# Patient Record
Sex: Female | Born: 1970 | Race: Black or African American | Hispanic: No | Marital: Single | Smoking: Never smoker
Health system: Southern US, Community
[De-identification: ages and names within clinical notes are randomized; demographics above are authoritative.]

## PROBLEM LIST (undated history)

## (undated) DIAGNOSIS — R51 Headache: Secondary | ICD-10-CM

## (undated) DIAGNOSIS — F32A Depression, unspecified: Secondary | ICD-10-CM

## (undated) DIAGNOSIS — F329 Major depressive disorder, single episode, unspecified: Secondary | ICD-10-CM

## (undated) DIAGNOSIS — R519 Headache, unspecified: Secondary | ICD-10-CM

## (undated) DIAGNOSIS — I10 Essential (primary) hypertension: Secondary | ICD-10-CM

## (undated) HISTORY — PX: BREAST SURGERY: SHX581

---

## 2004-11-20 ENCOUNTER — Emergency Department (HOSPITAL_COMMUNITY): Admission: EM | Admit: 2004-11-20 | Discharge: 2004-11-21 | Payer: Self-pay | Admitting: Emergency Medicine

## 2006-03-27 ENCOUNTER — Emergency Department (HOSPITAL_COMMUNITY): Admission: EM | Admit: 2006-03-27 | Discharge: 2006-03-27 | Payer: Self-pay | Admitting: Emergency Medicine

## 2008-01-07 ENCOUNTER — Inpatient Hospital Stay (HOSPITAL_COMMUNITY): Admission: AD | Admit: 2008-01-07 | Discharge: 2008-01-07 | Payer: Self-pay | Admitting: Obstetrics & Gynecology

## 2008-06-28 ENCOUNTER — Inpatient Hospital Stay (HOSPITAL_COMMUNITY): Admission: AD | Admit: 2008-06-28 | Discharge: 2008-06-28 | Payer: Self-pay | Admitting: Obstetrics and Gynecology

## 2008-07-24 ENCOUNTER — Inpatient Hospital Stay (HOSPITAL_COMMUNITY): Admission: AD | Admit: 2008-07-24 | Discharge: 2008-07-24 | Payer: Self-pay | Admitting: Obstetrics and Gynecology

## 2008-07-30 ENCOUNTER — Inpatient Hospital Stay (HOSPITAL_COMMUNITY): Admission: AD | Admit: 2008-07-30 | Discharge: 2008-07-30 | Payer: Self-pay | Admitting: Obstetrics and Gynecology

## 2008-08-04 ENCOUNTER — Inpatient Hospital Stay (HOSPITAL_COMMUNITY): Admission: RE | Admit: 2008-08-04 | Discharge: 2008-08-06 | Payer: Self-pay | Admitting: Obstetrics and Gynecology

## 2008-11-25 ENCOUNTER — Emergency Department (HOSPITAL_COMMUNITY): Admission: EM | Admit: 2008-11-25 | Discharge: 2008-11-25 | Payer: Self-pay | Admitting: Emergency Medicine

## 2008-11-26 ENCOUNTER — Emergency Department (HOSPITAL_COMMUNITY): Admission: EM | Admit: 2008-11-26 | Discharge: 2008-11-26 | Payer: Self-pay | Admitting: Emergency Medicine

## 2009-01-25 ENCOUNTER — Emergency Department (HOSPITAL_COMMUNITY): Admission: EM | Admit: 2009-01-25 | Discharge: 2009-01-25 | Payer: Self-pay | Admitting: Emergency Medicine

## 2009-02-01 ENCOUNTER — Emergency Department (HOSPITAL_COMMUNITY): Admission: EM | Admit: 2009-02-01 | Discharge: 2009-02-01 | Payer: Self-pay | Admitting: Emergency Medicine

## 2009-02-16 ENCOUNTER — Emergency Department (HOSPITAL_COMMUNITY): Admission: EM | Admit: 2009-02-16 | Discharge: 2009-02-16 | Payer: Self-pay | Admitting: Emergency Medicine

## 2010-08-16 LAB — CBC
HCT: 28.9 % — ABNORMAL LOW (ref 36.0–46.0)
Hemoglobin: 9.6 g/dL — ABNORMAL LOW (ref 12.0–15.0)
MCHC: 33.3 g/dL (ref 30.0–36.0)
RDW: 15.5 % (ref 11.5–15.5)

## 2010-08-17 LAB — URINE MICROSCOPIC-ADD ON

## 2010-08-17 LAB — CBC
HCT: 30.3 % — ABNORMAL LOW (ref 36.0–46.0)
MCHC: 33 g/dL (ref 30.0–36.0)
MCV: 81.9 fL (ref 78.0–100.0)
MCV: 82.6 fL (ref 78.0–100.0)
Platelets: 320 10*3/uL (ref 150–400)
Platelets: 330 10*3/uL (ref 150–400)
RBC: 3.83 MIL/uL — ABNORMAL LOW (ref 3.87–5.11)
WBC: 9 10*3/uL (ref 4.0–10.5)
WBC: 9 10*3/uL (ref 4.0–10.5)

## 2010-08-17 LAB — COMPREHENSIVE METABOLIC PANEL
ALT: 14 U/L (ref 0–35)
ALT: 16 U/L (ref 0–35)
AST: 27 U/L (ref 0–37)
AST: 29 U/L (ref 0–37)
AST: 38 U/L — ABNORMAL HIGH (ref 0–37)
Albumin: 2.4 g/dL — ABNORMAL LOW (ref 3.5–5.2)
Albumin: 2.5 g/dL — ABNORMAL LOW (ref 3.5–5.2)
Albumin: 2.7 g/dL — ABNORMAL LOW (ref 3.5–5.2)
Alkaline Phosphatase: 102 U/L (ref 39–117)
Alkaline Phosphatase: 123 U/L — ABNORMAL HIGH (ref 39–117)
BUN: 4 mg/dL — ABNORMAL LOW (ref 6–23)
BUN: 8 mg/dL (ref 6–23)
CO2: 21 mEq/L (ref 19–32)
Calcium: 8.7 mg/dL (ref 8.4–10.5)
Chloride: 103 mEq/L (ref 96–112)
Chloride: 106 mEq/L (ref 96–112)
Creatinine, Ser: 0.41 mg/dL (ref 0.4–1.2)
GFR calc Af Amer: >60 mL/min (ref >60)
GFR calc Af Amer: >60 mL/min (ref >60)
GFR calc non Af Amer: >60 mL/min (ref >60)
Potassium: 3.6 mEq/L (ref 3.5–5.1)
Potassium: 4.8 mEq/L (ref 3.5–5.1)
Sodium: 139 mEq/L (ref 135–145)
Total Bilirubin: 0.4 mg/dL (ref 0.3–1.2)
Total Bilirubin: 0.5 mg/dL (ref 0.3–1.2)
Total Protein: 5.5 g/dL — ABNORMAL LOW (ref 6.0–8.3)

## 2010-08-17 LAB — URINALYSIS, ROUTINE W REFLEX MICROSCOPIC
Bilirubin Urine: NEGATIVE
Glucose, UA: >1000 mg/dL — AB
Hgb urine dipstick: NEGATIVE
Hgb urine dipstick: NEGATIVE
Protein, ur: NEGATIVE mg/dL
Protein, ur: NEGATIVE mg/dL
Urobilinogen, UA: 0.2 mg/dL (ref 0.0–1.0)

## 2010-08-17 LAB — URIC ACID: Uric Acid, Serum: 3.1 mg/dL (ref 2.4–7.0)

## 2010-08-17 LAB — RPR: RPR Ser Ql: NONREACTIVE

## 2010-08-22 LAB — URINALYSIS, ROUTINE W REFLEX MICROSCOPIC
Bilirubin Urine: NEGATIVE
Glucose, UA: NEGATIVE mg/dL
Hgb urine dipstick: NEGATIVE
Ketones, ur: NEGATIVE mg/dL
Nitrite: NEGATIVE
Protein, ur: NEGATIVE mg/dL
Specific Gravity, Urine: 1.01 (ref 1.005–1.030)
Urobilinogen, UA: 0.2 mg/dL (ref 0.0–1.0)
pH: 6.5 (ref 5.0–8.0)

## 2010-08-22 LAB — URINE CULTURE: Colony Count: 80000

## 2010-08-22 LAB — URINE MICROSCOPIC-ADD ON
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
## 2011-02-07 LAB — URINALYSIS, ROUTINE W REFLEX MICROSCOPIC
Glucose, UA: 100 — AB
Ketones, ur: 15 — AB
Leukocytes, UA: NEGATIVE
Nitrite: POSITIVE — AB
Protein, ur: NEGATIVE
Urobilinogen, UA: 1

## 2011-02-07 LAB — WET PREP, GENITAL: Trich, Wet Prep: NONE SEEN

## 2011-02-07 LAB — CBC
HCT: 35.4 — ABNORMAL LOW
Hemoglobin: 11.8 — ABNORMAL LOW
MCHC: 33.3
MCV: 86.6
Platelets: 368
RDW: 13.1

## 2011-02-07 LAB — URINE CULTURE

## 2011-02-07 LAB — URINE MICROSCOPIC-ADD ON

## 2011-02-07 LAB — POCT PREGNANCY, URINE: Preg Test, Ur: POSITIVE

## 2013-02-04 ENCOUNTER — Emergency Department (HOSPITAL_COMMUNITY): Payer: Self-pay

## 2013-02-04 ENCOUNTER — Emergency Department (HOSPITAL_COMMUNITY)
Admission: EM | Admit: 2013-02-04 | Discharge: 2013-02-04 | Disposition: A | Discharge status: Home / Self Care | Payer: Self-pay | Attending: Emergency Medicine | Admitting: Emergency Medicine

## 2013-02-04 ENCOUNTER — Encounter (HOSPITAL_COMMUNITY): Payer: Self-pay

## 2013-02-04 DIAGNOSIS — M7731 Calcaneal spur, right foot: Secondary | ICD-10-CM

## 2013-02-04 DIAGNOSIS — M7730 Calcaneal spur, unspecified foot: Secondary | ICD-10-CM | POA: Insufficient documentation

## 2013-02-04 MED ORDER — IBUPROFEN 800 MG PO TABS: 800.0000 mg | ORAL_TABLET | Freq: Three times a day (TID) | ORAL | Status: DC

## 2013-02-04 MED ORDER — HYDROCODONE-ACETAMINOPHEN 5-325 MG PO TABS: 1.0000 | ORAL_TABLET | Freq: Four times a day (QID) | ORAL | Status: DC | PRN

## 2013-02-04 NOTE — ED Provider Notes (Signed)
CSN: [id number]     Arrival date & time 02/04/13  4098 History   First MD Initiated Contact with Patient 02/04/13 0930     Chief Complaint  Patient presents with  . Foot Pain   (Consider location/radiation/quality/duration/timing/severity/associated sxs/prior Treatment) HPI Comments: Patient presents to the emergency department with chief complaint of right heel pain. She states that the pain began approximately 2 weeks ago. It is progressively worsened. She denies any known injury. She states the pain is worse with weightbearing activity. It is also worse in the morning. 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 She states the pain is moderate to severe. She has not tried taking anything to alleviate her symptoms.  The history is provided by the patient. No language interpreter was used.    History reviewed. No pertinent past medical history. Past Surgical History  Procedure Laterality Date  . Breast surgery     No family history on file. History  Substance Use Topics  . Smoking status: Never Smoker   . Smokeless tobacco: Not on file  . Alcohol Use: No   OB History   Grav Para Term Preterm Abortions TAB SAB Ect Mult Living                 Review of Systems  All other systems reviewed and are negative.    Allergies  Review of patient's allergies indicates no known allergies.  Home Medications   Current Outpatient Rx  Name  Route  Sig  Dispense  Refill  . HYDROcodone-acetaminophen (NORCO/VICODIN) 5-325 MG per tablet   Oral   Take 1 tablet by mouth every 6 (six) hours as needed for pain.   13 tablet   0   . ibuprofen (ADVIL,MOTRIN) 800 MG tablet   Oral   Take 1 tablet (800 mg total) by mouth 3 (three) times daily.   21 tablet   0    BP 163/105  Pulse 86  Temp(Src) 98.2 F (36.8 C) (Oral)  Resp 16  SpO2 98%  LMP 01/05/2013 Physical Exam  Nursing note and vitals reviewed. Constitutional: She is oriented to person, place, and time. She appears well-developed and  well-nourished.  HENT:  Head: Normocephalic and atraumatic.  Eyes: Conjunctivae and EOM are normal.  Neck: Normal range of motion.  Cardiovascular: Normal rate and intact distal pulses.   Brisk capillary refill  Pulmonary/Chest: Effort normal.  Abdominal: She exhibits no distension.  Musculoskeletal: Normal range of motion.  Right Achilles insertion and calcaneus moderately tender to palpation, no obvious bony deformity or abnormality, range of motion and strength 5/5  Neurological: She is alert and oriented to person, place, and time.  Skin: Skin is dry.  Psychiatric: She has a normal mood and affect. Her behavior is normal. Judgment and thought content normal.    ED Course  Procedures (including critical care time) Labs Review Labs Reviewed - No data to display Imaging Review Dg Foot Complete Right  02/04/2013   CLINICAL DATA:  Right heel pain for 2 days, no known injury  EXAM: RIGHT FOOT COMPLETE - 3+ VIEW  COMPARISON:  None  FINDINGS: Osseous mineralization normal.  Joint spaces preserved.  Small plantar calcaneal spur.  No acute fracture, dislocation or bone destruction.  Soft tissues unremarkable.  IMPRESSION: Small plantar calcaneal spur.   Electronically Signed   By: Ulyses Southward M.D.   On: 02/04/2013 09:36    MDM   1. Heel spur, right    Patient with small calcaneal heel spur. Will  treat with NSAIDs, a few pain pills, and recommend Strengthening exercises, ice, and rest. Patient understands and agrees with the plan. She is stable and ready for discharge.    Roxy Horseman, PA-C 02/04/13 1022

## 2013-02-04 NOTE — ED Notes (Signed)
Pt c/o pain to rt heel of foot, unable to bare weight, no know injury

## 2013-02-04 NOTE — ED Provider Notes (Signed)
Medical screening examination/treatment/procedure(s) were performed by non-physician practitioner and as supervising physician I was immediately available for consultation/collaboration.  Rakeya Glab R. Benjermin Korber, MD 02/04/13 1612 

## 2013-08-05 ENCOUNTER — Emergency Department (HOSPITAL_COMMUNITY): Payer: Self-pay

## 2013-08-05 ENCOUNTER — Emergency Department (HOSPITAL_COMMUNITY)
Admission: EM | Admit: 2013-08-05 | Discharge: 2013-08-05 | Disposition: A | Discharge status: Home / Self Care | Payer: Self-pay | Attending: Emergency Medicine | Admitting: Emergency Medicine

## 2013-08-05 ENCOUNTER — Encounter (HOSPITAL_COMMUNITY): Payer: Self-pay | Admitting: Emergency Medicine

## 2013-08-05 DIAGNOSIS — M25539 Pain in unspecified wrist: Secondary | ICD-10-CM | POA: Insufficient documentation

## 2013-08-05 DIAGNOSIS — Z791 Long term (current) use of non-steroidal anti-inflammatories (NSAID): Secondary | ICD-10-CM | POA: Insufficient documentation

## 2013-08-05 MED ORDER — HYDROCODONE-ACETAMINOPHEN 5-325 MG PO TABS: 2.0000 | ORAL_TABLET | ORAL | Status: DC | PRN

## 2013-08-05 MED ORDER — IBUPROFEN 800 MG PO TABS: 800.0000 mg | ORAL_TABLET | Freq: Three times a day (TID) | ORAL | Status: DC

## 2013-08-05 MED ORDER — HYDROCODONE-ACETAMINOPHEN 5-325 MG PO TABS
2.0000 | ORAL_TABLET | Freq: Once | ORAL | Status: AC
  Administered 2013-08-05: 2 via ORAL
  Filled 2013-08-05: qty 2

## 2013-08-05 NOTE — Progress Notes (Signed)
[facility] did not get to see patient but will be sending information about [facility] orange Card program, using the address provided.

## 2013-08-05 NOTE — Discharge Instructions (Signed)
Carpal Tunnel Syndrome  The carpal tunnel is a narrow area located on the palm side of your wrist. The tunnel is formed by the wrist bones and ligaments. Nerves, blood vessels, and tendons pass through the carpal tunnel. Repeated wrist motion or certain diseases may cause swelling within the tunnel. This swelling pinches the main nerve in the wrist (median nerve) and causes the painful hand and arm condition called carpal tunnel syndrome.  CAUSES   · Repeated wrist motions.  · Wrist injuries.  · Certain diseases like arthritis, diabetes, alcoholism, hyperthyroidism, and kidney failure.  · Obesity.  · Pregnancy.  SYMPTOMS   · A "pins and needles" feeling in your fingers or hand.  · Tingling or numbness in your fingers or hand.  · An aching feeling in your entire arm.  · Wrist pain that goes up your arm to your shoulder.  · Pain that goes down into your palm or fingers.  · A weak feeling in your hands.  DIAGNOSIS   Your caregiver will take your history and perform a physical exam. An electromyography test may be needed. This test measures electrical signals sent out by the muscles. The electrical signals are usually slowed by carpal tunnel syndrome. You may also need X-rays.  TREATMENT   Carpal tunnel syndrome may clear up by itself. Your caregiver may recommend a wrist splint or medicine such as a nonsteroidal anti-inflammatory medicine. Cortisone injections may help. Sometimes, surgery may be needed to free the pinched nerve.   HOME CARE INSTRUCTIONS   · Take all medicine as directed by your caregiver. Only take over-the-counter or prescription medicines for pain, discomfort, or fever as directed by your caregiver.  · If you were given a splint to keep your wrist from bending, wear it as directed. It is important to wear the splint at night. Wear the splint for as long as you have pain or numbness in your hand, arm, or wrist. This may take 1 to 2 months.  · Rest your wrist from any activity that may be causing your  pain. If your symptoms are work-related, you may need to talk to your employer about changing to a job that does not require using your wrist.  · Put ice on your wrist after long periods of wrist activity.  · Put ice in a plastic bag.  · Place a towel between your skin and the bag.  · Leave the ice on for 15-20 minutes, 03-04 times a day.  · Keep all follow-up visits as directed by your caregiver. This includes any orthopedic referrals, physical therapy, and rehabilitation. Any delay in getting necessary care could result in a delay or failure of your condition to heal.  SEEK IMMEDIATE MEDICAL CARE IF:   · You have new, unexplained symptoms.  · Your symptoms get worse and are not helped or controlled with medicines.  MAKE SURE YOU:   · Understand these instructions.  · Will watch your condition.  · Will get help right away if you are not doing well or get worse.  Document Released: 04/20/2000 Document Revised: 07/16/2011 Document Reviewed: 03/09/2011  ExitCare® Patient Information ©2014 [employer].  Carpal Tunnel Release  Carpal tunnel release is done to relieve the pressure on the nerves and tendons on the bottom side of your wrist.   LET YOUR CAREGIVER KNOW ABOUT:   · Allergies to food or medicine.  · Medicines taken, including vitamins, herbs, eyedrops, over-the-counter medicines, and creams.  · Use of steroids (by mouth   or creams).  · Previous problems with anesthetics or numbing medicines.  · History of bleeding problems or blood clots.  · Previous surgery.  · Other health problems, including diabetes and kidney problems.  · Possibility of pregnancy, if this applies.  RISKS AND COMPLICATIONS   Some problems that may happen after this procedure include:  · Infection.  · Damage to the nerves, arteries or tendons could occur. This would be very uncommon.  · Bleeding.  BEFORE THE PROCEDURE   · This surgery may be done while you are asleep (general anesthetic) or may be done under a block where only your forearm  and the surgical area is numb.  · If the surgery is done under a block, the numbness will gradually wear off within several hours after surgery.  HOME CARE INSTRUCTIONS   · Have a responsible person with you for 24 hours.  · Do not drive a car or use public transportation for 24 hours.  · Only take over-the-counter or prescription medicines for pain, discomfort, or fever as directed by your caregiver. Take them as directed.  · You may put ice on the palm side of the affected wrist.  · Put ice in a plastic bag.  · Place a towel between your skin and the bag.  · Leave the ice on for 20 to 30 minutes, 4 times per day.  · If you were given a splint to keep your wrist from bending, use it as directed. It is important to wear the splint at night or as directed. Use the splint for as long as you have pain or numbness in your hand, arm, or wrist. This may take 1 to 2 months.  · Keep your hand raised (elevated) above the level of your heart as much as possible. This keeps swelling down and helps with discomfort.  · Change bandages (dressings) as directed.  · Keep the wound clean and dry.  SEEK MEDICAL CARE IF:   · You develop pain not relieved with medications.  · You develop numbness of your hand.  · You develop bleeding from your surgical site.  · You have an oral temperature above 102° F (38.9° C).  · You develop redness or swelling of the surgical site.  · You develop new, unexplained problems.  SEEK IMMEDIATE MEDICAL CARE IF:   · You develop a rash.  · You have difficulty breathing.  · You develop any reaction or side effects to medications given.  Document Released: 07/14/2003 Document Revised: 07/16/2011 Document Reviewed: 02/27/2007  ExitCare® Patient Information ©2014 [employer].

## 2013-08-05 NOTE — ED Provider Notes (Signed)
Medical screening examination/treatment/procedure(s) were performed by non-physician practitioner and as supervising physician I was immediately available for consultation/collaboration.   EKG Interpretation None        Linda Moon Fifield, MD 08/05/13 1421

## 2013-08-05 NOTE — ED Notes (Signed)
Pt A+Ox4, reports c/o 8/10 pain to R wrist since yesterday, pt denies known injury.  Reports "i was at a birthday party yesterday and something might have happened".  MAEI, +csm/+pulses, mild swelling noted, no bruises or deformities noted.  Pt denies n/t to extremities.  Skin PWD.  Speaking full/clear sentences.  NAD.

## 2013-08-05 NOTE — ED Notes (Signed)
Awaiting ortho tech for splint placement.  

## 2013-08-05 NOTE — ED Provider Notes (Signed)
CSN: [id number]     Arrival date & time 08/05/13  1304 History   First MD Initiated Contact with Patient 08/05/13 1308     Chief Complaint  Patient presents with  . Wrist Pain     (Consider location/radiation/quality/duration/timing/severity/associated sxs/prior Treatment) HPI Comments: Patient presents to the emergency department with chief complaint of right wrist pain. She states that she was at a birthday party yesterday, and was doing lots of lifting and cooking. 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 She states the pain is worsened with wrist extension. The pain is 8/10. She has not tried anything to alleviate her symptoms. She denies any other mechanism of injury. Denies any numbness, or tingling.  The history is provided by the patient. No language interpreter was used.    History reviewed. No pertinent past medical history. Past Surgical History  Procedure Laterality Date  . Breast surgery     No family history on file. History  Substance Use Topics  . Smoking status: Never Smoker   . Smokeless tobacco: Not on file  . Alcohol Use: No   OB History   Grav Para Term Preterm Abortions TAB SAB Ect Mult Living                 Review of Systems  Constitutional: Negative for fever and chills.  Respiratory: Negative for shortness of breath.   Cardiovascular: Negative for chest pain.  Gastrointestinal: Negative for nausea, vomiting, diarrhea and constipation.  Genitourinary: Negative for dysuria.  Musculoskeletal: Positive for arthralgias.      Allergies  Review of patient's allergies indicates no known allergies.  Home Medications   Current Outpatient Rx  Name  Route  Sig  Dispense  Refill  . HYDROcodone-acetaminophen (NORCO/VICODIN) 5-325 MG per tablet   Oral   Take 1 tablet by mouth every 6 (six) hours as needed for pain.   13 tablet   0   . ibuprofen (ADVIL,MOTRIN) 800 MG tablet   Oral   Take 1 tablet (800 mg  total) by mouth 3 (three) times daily.   21 tablet   0    BP 162/99  Pulse 101  Temp(Src) 98.4 F (36.9 C) (Oral)  Resp 16  SpO2 97% Physical Exam  Nursing note and vitals reviewed. Constitutional: She is oriented to person, place, and time. She appears well-developed and well-nourished.  HENT:  Head: Normocephalic and atraumatic.  Eyes: Conjunctivae and EOM are normal.  Neck: Normal range of motion.  Cardiovascular: Normal rate.   Pulmonary/Chest: Effort normal.  Abdominal: She exhibits no distension.  Musculoskeletal: Normal range of motion.  Right wrist tender to palpation over the anterior aspect, positive Tinel, positive Phalen, range of motion and strength is reduced secondary to pain  Neurological: She is alert and oriented to person, place, and time.  Skin: Skin is dry.  Psychiatric: She has a normal mood and affect. Her behavior is normal. Judgment and thought content normal.    ED Course  Procedures (including critical care time) Labs Review Labs Reviewed - No data to display Imaging Review No results found.   EKG Interpretation None      MDM   Final diagnoses:  Wrist pain    Patient with right wrist pain. I suspect this is carpal tunnel. However, I will check the wrist plain films. If negative, will splint the rest, and give pain medicine, and hand follow-up.  Plain films negative. Discharge per above.  Roxy Horsemanobert Terriona Horlacher, PA-C 08/05/13 [phone]

## 2014-05-16 ENCOUNTER — Encounter (HOSPITAL_COMMUNITY): Payer: Self-pay | Admitting: Emergency Medicine

## 2014-05-16 ENCOUNTER — Emergency Department (HOSPITAL_COMMUNITY): Payer: Self-pay

## 2014-05-16 ENCOUNTER — Emergency Department (HOSPITAL_COMMUNITY)
Admission: EM | Admit: 2014-05-16 | Discharge: 2014-05-16 | Disposition: A | Discharge status: Home / Self Care | Payer: Self-pay | Attending: Emergency Medicine | Admitting: Emergency Medicine

## 2014-05-16 DIAGNOSIS — Y998 Other external cause status: Secondary | ICD-10-CM | POA: Insufficient documentation

## 2014-05-16 DIAGNOSIS — W1841XA Slipping, tripping and stumbling without falling due to stepping on object, initial encounter: Secondary | ICD-10-CM | POA: Insufficient documentation

## 2014-05-16 DIAGNOSIS — Y9289 Other specified places as the place of occurrence of the external cause: Secondary | ICD-10-CM | POA: Insufficient documentation

## 2014-05-16 DIAGNOSIS — Y9389 Activity, other specified: Secondary | ICD-10-CM | POA: Insufficient documentation

## 2014-05-16 DIAGNOSIS — S93401A Sprain of unspecified ligament of right ankle, initial encounter: Secondary | ICD-10-CM | POA: Insufficient documentation

## 2014-05-16 DIAGNOSIS — M79673 Pain in unspecified foot: Secondary | ICD-10-CM

## 2014-05-16 MED ORDER — HYDROCODONE-ACETAMINOPHEN 5-325 MG PO TABS
2.0000 | ORAL_TABLET | Freq: Once | ORAL | Status: AC
  Administered 2014-05-16: 2 via ORAL
  Filled 2014-05-16: qty 2

## 2014-05-16 MED ORDER — IBUPROFEN 800 MG PO TABS: 800.0000 mg | ORAL_TABLET | Freq: Three times a day (TID) | ORAL | Status: DC

## 2014-05-16 NOTE — ED Provider Notes (Signed)
CSN: [id number]     Arrival date & time 05/16/14  0827 History   First MD Initiated Contact with Patient 05/16/14 [phone]     Chief Complaint  Patient presents with  . Ankle Pain  . Foot Pain     (Consider location/radiation/quality/duration/timing/severity/associated sxs/prior Treatment) HPI Linda Moon is a 44 year old female who presents the ER complaining of right foot pain 1 week. Patient reports she's had this pain in the past, never has had some improvement. She reports over the past week the pain has gotten worse, with worsening of her symptoms throughout the day as she walks on her foot. Patient states this morning she tripped over her dog, twisting her ankle, and is now having foot and ankle pain. Patient denies numbness, weakness, loss of sensation or function.  History reviewed. No pertinent past medical history. Past Surgical History  Procedure Laterality Date  . Breast surgery     No family history on file. History  Substance Use Topics  . Smoking status: Never Smoker   . Smokeless tobacco: Not on file  . Alcohol Use: No   OB History    No data available     Review of Systems  Musculoskeletal: Positive for arthralgias.  Neurological: Negative for weakness and numbness.      Allergies  Review of patient's allergies indicates no known allergies.  Home Medications   Prior to Admission medications   Medication Sig Start Date End Date Taking? Authorizing Provider  ibuprofen (ADVIL,MOTRIN) 800 MG tablet Take 1 tablet (800 mg total) by mouth 3 (three) times daily. 05/16/14   Monte FantasiaJoseph W Debarah Mccumbers, PA-C   BP 163/99 mmHg  Pulse 90  Temp(Src) 98.6 F (37 C) (Oral)  Resp 18  Ht 5\' 4"  (1.626 m)  Wt 186 lb (84.369 kg)  BMI 31.91 kg/m2  SpO2 100%  LMP 05/02/2014 Physical Exam  Constitutional: She appears well-developed and well-nourished. No distress.  HENT:  Head: Normocephalic and atraumatic.  Eyes: Conjunctivae are normal. Right eye exhibits no discharge. Left eye  exhibits no discharge. No scleral icterus.  Cardiovascular:  Peripheral pulses intact at injured extremity.   Pulmonary/Chest: Effort normal. No respiratory distress.  Musculoskeletal:  Right ankle exam: No obvious erythema, edema, warmth, deformity noted. Patient has full range of motion to plantar/dorsiflexion, inversion/eversion with mild amount of pain elicited. Moderate amount of tenderness to palpation of the calcaneal region and dorsal aspect of foot. DP pulse 2+. Distal sensation intact. Capillary refill less than 2 seconds distally.  Neurological: She is alert.  No numbness distal to injury.    Skin: Skin is warm and dry. No rash noted. She is not diaphoretic.  Nursing note and vitals reviewed.   ED Course  Procedures (including critical care time) Labs Review Labs Reviewed - No data to display  Imaging Review Dg Ankle Complete Right  05/16/2014   CLINICAL DATA:  Patient with right heel pain for 1 month. Patient tripped over dog. Pain and swelling on the medial and lateral malleolus.  EXAM: RIGHT ANKLE - COMPLETE 3+ VIEW  COMPARISON:  None.  FINDINGS: Normal anatomic alignment. No evidence for acute fracture or dislocation. Talar dome is intact. Plantar calcaneal spurring. On the oblique view there is a nonspecific radiodensity projecting within the soft tissues overlying the distal aspect of the fibula. Soft tissue swelling about the lateral malleolus.  IMPRESSION: No evidence for acute fracture or dislocation.  Soft tissue swelling about the lateral malleolus.  On the oblique view there is an nonspecific radiodensity  projecting within the soft tissues overlying the distal aspect of the fibula. Recommend clinical correlation as this may overlie the patient.   Electronically Signed   By: Annia Belt M.D.   On: 05/16/2014 10:16   Dg Foot Complete Right  05/16/2014   CLINICAL DATA:  Pain and swelling following tripping injury. One month history of calcaneus region pain  EXAM: RIGHT FOOT  COMPLETE - 3+ VIEW  COMPARISON:  February 04, 2013  FINDINGS: Frontal, oblique, and lateral views were obtained. There is again noted a prominent inferior calcaneal spur. There is no demonstrable fracture or dislocation. There is no appreciable joint space narrowing. No erosive change.  IMPRESSION: Inferior calcaneal spur. No fracture or dislocation. No appreciable arthropathy.   Electronically Signed   By: Bretta Bang M.D.   On: 05/16/2014 10:14     EKG Interpretation None      MDM   Final diagnoses:  Foot pain  Ankle sprain, right, initial encounter    Patient X-Ray negative for obvious fracture or dislocation. Pain managed in ED. Pt advised to follow up with orthopedics if symptoms persist for possible occult fracture or most likely ankle sprain.. Patient given brace while in ED, conservative therapy recommended and discussed. I also discussed return precautions with patient. Patient will be dc home & is agreeable with above plan. I encouraged patient to call or return to the ER should she have any questions or concerns.  BP 163/99 mmHg  Pulse 90  Temp(Src) 98.6 F (37 C) (Oral)  Resp 18  Ht  (1.626 m)  Wt 186 lb (84.369 kg)  BMI 31.91 kg/m2  SpO2 100%  LMP 05/02/2014  Signed,  Ladona Mow, PA-C 5:18 PM    Monte Fantasia, PA-C 05/16/14 1718  Linwood Dibbles, MD 05/17/14 [phone]

## 2014-05-16 NOTE — ED Notes (Signed)
Declined W/C at D/C and was escorted to lobby by RN. 

## 2014-05-16 NOTE — Discharge Instructions (Signed)
Follow-up with orthopedics. Return to the ER if any worsening of symptoms, severe swelling, redness, warmth, high fever, numbness or weakness.  Ankle Sprain An ankle sprain is an injury to the strong, fibrous tissues (ligaments) that hold the bones of your ankle joint together.  CAUSES An ankle sprain is usually caused by a fall or by twisting your ankle. Ankle sprains most commonly occur when you step on the outer edge of your foot, and your ankle turns inward. People who participate in sports are more prone to these types of injuries.  SYMPTOMS   Pain in your ankle. The pain may be present at rest or only when you are trying to stand or walk.  Swelling.  Bruising. Bruising may develop immediately or within 1 to 2 days after your injury.  Difficulty standing or walking, particularly when turning corners or changing directions. DIAGNOSIS  Your caregiver will ask you details about your injury and perform a physical exam of your ankle to determine if you have an ankle sprain. During the physical exam, your caregiver will press on and apply pressure to specific areas of your foot and ankle. Your caregiver will try to move your ankle in certain ways. An X-ray exam may be done to be sure a bone was not broken or a ligament did not separate from one of the bones in your ankle (avulsion fracture).  TREATMENT  Certain types of braces can help stabilize your ankle. Your caregiver can make a recommendation for this. Your caregiver may recommend the use of medicine for pain. If your sprain is severe, your caregiver may refer you to a surgeon who helps to restore function to parts of your skeletal system (orthopedist) or a physical therapist. HOME CARE INSTRUCTIONS   Apply ice to your injury for 1-2 days or as directed by your caregiver. Applying ice helps to reduce inflammation and pain.  Put ice in a plastic bag.  Place a towel between your skin and the bag.  Leave the ice on for 15-20 minutes at a  time, every 2 hours while you are awake.  Only take over-the-counter or prescription medicines for pain, discomfort, or fever as directed by your caregiver.  Elevate your injured ankle above the level of your heart as much as possible for 2-3 days.  If your caregiver recommends crutches, use them as instructed. Gradually put weight on the affected ankle. Continue to use crutches or a cane until you can walk without feeling pain in your ankle.  If you have a plaster splint, wear the splint as directed by your caregiver. Do not rest it on anything harder than a pillow for the first 24 hours. Do not put weight on it. Do not get it wet. You may take it off to take a shower or bath.  You may have been given an elastic bandage to wear around your ankle to provide support. If the elastic bandage is too tight (you have numbness or tingling in your foot or your foot becomes cold and blue), adjust the bandage to make it comfortable.  If you have an air splint, you may blow more air into it or let air out to make it more comfortable. You may take your splint off at night and before taking a shower or bath. Wiggle your toes in the splint several times per day to decrease swelling. SEEK MEDICAL CARE IF:   You have rapidly increasing bruising or swelling.  Your toes feel extremely cold or you lose feeling in  your foot.  Your pain is not relieved with medicine. SEEK IMMEDIATE MEDICAL CARE IF:  Your toes are numb or blue.  You have severe pain that is increasing. MAKE SURE YOU:   Understand these instructions.  Will watch your condition.  Will get help right away if you are not doing well or get worse. Document Released: 04/23/2005 Document Revised: 01/16/2012 Document Reviewed: 05/05/2011 [facility] Patient Information 2015 [place], [place]. This information is not intended to replace advice given to you by your health care provider. Make sure you discuss any questions you have with your health care  provider.

## 2014-05-16 NOTE — ED Notes (Signed)
Pt. Stated, I started having ankle and foot pain for a week, no injury. Unable to put hardly any weight on it.

## 2014-08-31 ENCOUNTER — Emergency Department (HOSPITAL_COMMUNITY): Payer: Self-pay

## 2014-08-31 ENCOUNTER — Encounter (HOSPITAL_COMMUNITY): Payer: Self-pay | Admitting: *Deleted

## 2014-08-31 ENCOUNTER — Emergency Department (HOSPITAL_COMMUNITY)
Admission: EM | Admit: 2014-08-31 | Discharge: 2014-08-31 | Disposition: A | Discharge status: Home / Self Care | Payer: Self-pay | Attending: Emergency Medicine | Admitting: Emergency Medicine

## 2014-08-31 DIAGNOSIS — Z791 Long term (current) use of non-steroidal anti-inflammatories (NSAID): Secondary | ICD-10-CM | POA: Insufficient documentation

## 2014-08-31 DIAGNOSIS — J069 Acute upper respiratory infection, unspecified: Secondary | ICD-10-CM | POA: Insufficient documentation

## 2014-08-31 DIAGNOSIS — R197 Diarrhea, unspecified: Secondary | ICD-10-CM | POA: Insufficient documentation

## 2014-08-31 DIAGNOSIS — R1011 Right upper quadrant pain: Secondary | ICD-10-CM | POA: Insufficient documentation

## 2014-08-31 DIAGNOSIS — Z3202 Encounter for pregnancy test, result negative: Secondary | ICD-10-CM | POA: Insufficient documentation

## 2014-08-31 DIAGNOSIS — R112 Nausea with vomiting, unspecified: Secondary | ICD-10-CM | POA: Insufficient documentation

## 2014-08-31 LAB — URINALYSIS, ROUTINE W REFLEX MICROSCOPIC
Glucose, UA: NEGATIVE mg/dL
Hgb urine dipstick: NEGATIVE
KETONES UR: 15 mg/dL — AB
Nitrite: NEGATIVE
PH: 6.5 (ref 5.0–8.0)
Protein, ur: 100 mg/dL — AB
Specific Gravity, Urine: 1.027 (ref 1.005–1.030)
UROBILINOGEN UA: 1 mg/dL (ref 0.0–1.0)

## 2014-08-31 LAB — CBC WITH DIFFERENTIAL/PLATELET
Basophils Absolute: 0 10*3/uL (ref 0.0–0.1)
Basophils Relative: 0 % (ref 0–1)
Eosinophils Absolute: 0.1 10*3/uL (ref 0.0–0.7)
Eosinophils Relative: 1 % (ref 0–5)
HEMATOCRIT: 42.9 % (ref 36.0–46.0)
HEMOGLOBIN: 14.5 g/dL (ref 12.0–15.0)
LYMPHS PCT: 14 % (ref 12–46)
Lymphs Abs: 1.4 10*3/uL (ref 0.7–4.0)
MCH: 29.3 pg (ref 26.0–34.0)
MCHC: 33.8 g/dL (ref 30.0–36.0)
MCV: 86.7 fL (ref 78.0–100.0)
MONOS PCT: 8 % (ref 3–12)
Monocytes Absolute: 0.8 10*3/uL (ref 0.1–1.0)
NEUTROS ABS: 8.1 10*3/uL — AB (ref 1.7–7.7)
NEUTROS PCT: 77 % (ref 43–77)
Platelets: 259 10*3/uL (ref 150–400)
RBC: 4.95 MIL/uL (ref 3.87–5.11)
RDW: 12.7 % (ref 11.5–15.5)
WBC: 10.4 10*3/uL (ref 4.0–10.5)

## 2014-08-31 LAB — URINE MICROSCOPIC-ADD ON

## 2014-08-31 LAB — POC URINE PREG, ED: PREG TEST UR: NEGATIVE

## 2014-08-31 LAB — COMPREHENSIVE METABOLIC PANEL
ALK PHOS: 60 U/L (ref 39–117)
ALT: 162 U/L — AB (ref 0–35)
ANION GAP: 12 (ref 5–15)
AST: 229 U/L — AB (ref 0–37)
Albumin: 4.3 g/dL (ref 3.5–5.2)
BUN: 7 mg/dL (ref 6–23)
CALCIUM: 9.6 mg/dL (ref 8.4–10.5)
CHLORIDE: 100 mmol/L (ref 96–112)
CO2: 22 mmol/L (ref 19–32)
CREATININE: 0.68 mg/dL (ref 0.50–1.10)
Glucose, Bld: 127 mg/dL — ABNORMAL HIGH (ref 70–99)
Potassium: 4.4 mmol/L (ref 3.5–5.1)
SODIUM: 134 mmol/L — AB (ref 135–145)
TOTAL PROTEIN: 8.1 g/dL (ref 6.0–8.3)
Total Bilirubin: 1.3 mg/dL — ABNORMAL HIGH (ref 0.3–1.2)

## 2014-08-31 LAB — LIPASE, BLOOD: LIPASE: 36 U/L (ref 11–59)

## 2014-08-31 MED ORDER — MORPHINE SULFATE 4 MG/ML IJ SOLN
4.0000 mg | Freq: Once | INTRAMUSCULAR | Status: AC
  Administered 2014-08-31: 4 mg via INTRAVENOUS
  Filled 2014-08-31: qty 1

## 2014-08-31 MED ORDER — ONDANSETRON HCL 4 MG PO TABS: 4.0000 mg | ORAL_TABLET | Freq: Four times a day (QID) | ORAL | Status: DC

## 2014-08-31 MED ORDER — DIPHENHYDRAMINE HCL 50 MG/ML IJ SOLN
25.0000 mg | Freq: Once | INTRAMUSCULAR | Status: AC
  Administered 2014-08-31: 25 mg via INTRAVENOUS
  Filled 2014-08-31: qty 1

## 2014-08-31 MED ORDER — SODIUM CHLORIDE 0.9 % IV BOLUS (SEPSIS)
1000.0000 mL | INTRAVENOUS | Status: AC
  Administered 2014-08-31: 1000 mL via INTRAVENOUS

## 2014-08-31 MED ORDER — PROCHLORPERAZINE EDISYLATE 5 MG/ML IJ SOLN
10.0000 mg | Freq: Once | INTRAMUSCULAR | Status: AC
  Administered 2014-08-31: 10 mg via INTRAVENOUS
  Filled 2014-08-31: qty 2

## 2014-08-31 MED ORDER — PHENYLEPHRINE-DM-GG-APAP 5-10-200-325 MG PO CAPS: 2.0000 | ORAL_CAPSULE | Freq: Three times a day (TID) | ORAL | Status: DC

## 2014-08-31 MED ORDER — KETOROLAC TROMETHAMINE 30 MG/ML IJ SOLN
30.0000 mg | Freq: Once | INTRAMUSCULAR | Status: AC
  Administered 2014-08-31: 30 mg via INTRAVENOUS
  Filled 2014-08-31: qty 1

## 2014-08-31 NOTE — Discharge Instructions (Signed)
Please follow the directions provided. Be sure to follow-up with the gastroenterologist provided for further evaluation of the fatty liver seen on ultrasound. Also use the resource guide below or the referral given to establish care with a primary care doctor to ensure you're getting better.  Be sure to drink plenty of fluids by mouth to stay well hydrated. He may use the Zofran as needed for nausea. You may take the multisymptom cold medicine to help with your ear fullness and nasal congestion and coughing. Don't hesitate to return for any new, worsening, or concerning symptoms.   SEEK IMMEDIATE MEDICAL CARE IF:  You have a fever.  You develop severe or persistent headache, ear pain, sinus pain, or chest pain.  You develop wheezing, a prolonged cough, cough up blood, or have a change in your usual mucus (if you have chronic lung disease).  You develop sore muscles or a stiff neck.  SEEK IMMEDIATE MEDICAL CARE IF:  Your pain does not go away within 2 hours.  You keep throwing up (vomiting).  Your pain is felt only in portions of the abdomen, such as the right side or the left lower portion of the abdomen.  You pass bloody or black tarry stools.   Emergency Department Resource Guide 1) Find a Doctor and Pay Out of Pocket Although you won't have to find out who is covered by your insurance plan, it is a good idea to ask around and get recommendations. You will then need to call the office and see if the doctor you have chosen will accept you as a new patient and what types of options they offer for patients who are self-pay. Some doctors offer discounts or will set up payment plans for their patients who do not have insurance, but you will need to ask so you aren't surprised when you get to your appointment.  2) Contact Your Local Health Department Not all health departments have doctors that can see patients for sick visits, but many do, so it is worth a call to see if yours does. If you don't  know where your local health department is, you can check in your phone book. The CDC also has a tool to help you locate your state's health department, and many state websites also have listings of all of their local health departments.  3) Find a Walk-in Clinic If your illness is not likely to be very severe or complicated, you may want to try a walk in clinic. These are popping up all over the country in pharmacies, drugstores, and shopping centers. They're usually staffed by nurse practitioners or physician assistants that have been trained to treat common illnesses and complaints. They're usually fairly quick and inexpensive. However, if you have serious medical issues or chronic medical problems, these are probably not your best option.  No Primary Care Doctor: - Call Health Connect at  [phone] - they can help you locate a primary care doctor that  accepts your insurance, provides certain services, etc. - Physician Referral Service- [phone]  Chronic Pain Problems: Organization         Address  Phone   Notes  [facility]  [phone] Patients need to be referred by their primary care doctor.   Medication Assistance: Organization         Address  Phone   Notes  [facility] [address] [place], [place] [phone] --Must be a resident of [place] -- Must have NO insurance coverage whatsoever (no Medicaid/ Medicare, etc.) -- The pt. MUST have a primary care doctor that directs their care regularly and follows them in the community   [facility]  [phone]   [employer]  [phone]    Agencies that provide inexpensive medical care: Organization         Address  Phone   Notes  [facility]  [phone]   [facility]    [phone]   [facility] [address] [place], [place] [address] [phone]   [facility]  [place] [address] [place] [place] [phone]   Planned Parenthood    [phone]   [facility]    [phone]   [facility]  [address], [place] Phone:  [phone], Fax:  [phone] Hours of Operation:  9 am - 6 pm, M-F.  Also accepts Medicaid/Medicare and self-pay.  [facility]  [address], [place] Phone: [phone], Fax: [phone]. Hours of Operation:  8:30 am - 5:30 pm, M-F.  Also accepts Medicaid and self-pay.  [facility] [place] [address] [place] Phone: [phone]   [facility] [address] Natasha Bence [place], [place] [phone], Ext. [phone] Mondays & Thursdays: 7-9 AM.  First 15 patients are seen on a first come, first serve basis.    Medicaid-accepting [facility] Providers:  Organization         Address  Phone   Notes  [facility] [address], [place] [phone] Also accepts self-pay patients.  [facility] [address] Laurell Josephs [place], [place]  [phone]   [facility] [address], [place] [phone]   [facility] [address] [place] [phone]   Renaye Rakers [address], [place]   [phone] Only accepts [place] patients after they have their name applied to their card.   Self-Pay (no insurance) in [facility]:  Organization         Address  Phone   Notes  Sickle Cell Patients, [facility] [address] [place], [place] [phone]   [facility] [address] [place], [place] [phone]   [facility]  [address], [place] [phone]   [facility]/Dr. Osei-Bonsu  [address], [place] or [address], [place] [phone] Phone  number for both [place] and [place] locations is the same.  [facility] [address] [place] [phone]   [facility] [address] [place] or [address] [phone]   [facility] [address] [place] [phone], phone; [phone], fax Sees patients 1st and 3rd Saturday of every month.  Must not qualify for public or private insurance (i.e. Medicaid, Medicare, [place] Health Choice, Veterans' Benefits)  Household income should be no more than 200% of the poverty level The clinic cannot treat you if you are pregnant or think you are pregnant  Sexually transmitted diseases are not treated at the clinic.    Dental Care: Organization         Address  Phone  Notes  [facility] of [facility] [address] [place], [place] [phone] Accepts children up to age 76 who are enrolled in [place] or [place] Health Choice; pregnant women with a Medicaid card; and children who have applied for Medicaid or [place] Health Choice, but were declined, whose parents can pay a reduced fee at time of service.  [facility] Department of [facility]  [address], [place] [phone] Accepts children up to age 23 who are enrolled in [place] or [place] Health Choice; pregnant women with a Medicaid card; and children who have applied for Medicaid or [place] Health Choice, but were declined, whose parents can pay a reduced fee at time of service.  [facility]  [address] [place], [place] [phone] Patients are seen by appointment only. Walk-ins are not accepted. [facility] will see patients 64 years of age and older. Monday - Tuesday (8am-5pm) Most Wednesdays (8:30-5pm) $30 per visit, cash only  [facility] Adult Dental Access PROGRAM  [address], [facility] [phone] Patients are seen by appointment only.  Walk-ins are not accepted. [facility] will see patients 77 years of age and older. One Wednesday Evening (Monthly: Volunteer Based).  $30 per visit, cash only  [employer]  [phone] for adults; Children under age 5, call Graduate Pediatric Dentistry at [phone]. Children aged 33-14, please call [phone] to request a pediatric application.  Dental services are provided in all areas of dental care including fillings, crowns and bridges, complete and partial dentures, implants, gum treatment, root canals, and extractions. Preventive care is also provided. Treatment is provided to both adults and children. Patients are selected via a lottery and there is often a waiting list.   [facility] [address] [place]  [phone] [email or web address]   [facility] [address] [place], [place] [phone], Ext. [phone] Second and Fourth Thursday of each month, opens at 6:30 AM; Clinic ends at 9 AM.  Patients are seen on a first-come first-served basis, and a limited number are seen during each clinic.   [facility]  [address] Ether Griffins [place], [place] [phone]   Eligibility Requirements You must have lived in [place], [place], or [place] counties for at least the last three months.   You cannot be eligible for state or federal sponsored [place], including [employer], [place], or [employer].   You generally cannot be eligible for healthcare insurance through your employer.    How to apply: Eligibility screenings are held every Tuesday and Wednesday afternoon from 1:00 pm until 4:00 pm. You do not need an appointment for the interview!  [facility] [address], [place], [place] [phone]   [facility] Health Department  [phone]   [facility] Health Department  [phone]   [facility] Health Department  [phone]    Behavioral Health  Resources in the Community: Intensive Outpatient Programs Organization         Address  Phone  Notes  [facility] [address] [place], [place] [phone]   [facility] [address] [place], [place] [phone]   [facility]: [facility] [address], [place], [place]  [phone]   [facility] Health [address],  [place], [place] [phone] or [phone]   Substance Abuse Resources Organization  Address  Phone  Notes  [facility] Drug Services  [phone]   [facility]  [phone]   [facility] [place]  [phone]   Floydene Flock  [phone]   Residential & Outpatient Substance Abuse Program  [phone]   Psychological Services Organization         Address  Phone  Notes  [facility]  [phone]   [facility]  [phone]   [facility] Mental Health [address] [place] [phone] or [phone]    Mobile Crisis Teams Organization         Address  Phone  Notes  [facility], Mobile Crisis Care Unit  [phone]   [facility]  [address] [place], [place] [phone]   Doristine Locks [address] [place] [phone]    Self-Help/Support Groups Organization         Address  Phone             Notes  [facility] [place] - variety of support groups  336- I7437963 Call for more information  Narcotics Anonymous (NA), [facility] [address], [employer] [place]  2 meetings at this location   Statistician         Address  Phone  Notes  [facility] [address],    [place]  [phone]   [facility]  [address] [place] [address] [place] [address], [place], [place] [phone]   [facility] [address] [place], [place] [phone] Admissions: 8am-3pm M-F  [facility] [address]    [place], [place] [phone]   [facility] [address] [place], [place], [place] [phone]   [facility] [address]  [place], [place] [phone]   [facility] - Intensive Outpatient [address] Laurell Joseph[address], [place], [place] [phone]   [facility] (Addiction Recovery [facility] [address] [place]  [place], [place] [phone] or [phone]   [facility] ([facility]) [address] [place], [place] [phone] Accepts Medicaid  [facility] [address]  [place] [phone] Substance Abuse/Addiction Treatment   [facility] Organization         Address  Phone  Notes  [facility]  [phone]   Angie Fava, PhD [address] Ervin Knack [place], [place]   [phone] or [phone]   [facility]al   [address] [place], [place] [phone]   [facility] [address] [place], [place] [phone] Insurance/Medicaid/sponsorship through [facility] and Families [address]                                    [place], [place] [phone] Therapy/tele-psych/case  [facility] [address], [place] [phone]    Dr. Lolly Mustache  [phone]   [facility]  [employer] [facility] Dept. 1) [address] [place] 2) [address] [place] 3)  [address], [place] [phone]  [phone]   [facility] Child Abuse Hotline (626)531-9097 or [phone] (After Hours)

## 2014-08-31 NOTE — ED Provider Notes (Signed)
CSN: [id number]     Arrival date & time 08/31/14  1004 History   First MD Initiated Contact with Patient 08/31/14 1021     Chief Complaint  Patient presents with  . URI  . Migraine  . Abdominal Pain   (Consider location/radiation/quality/duration/timing/severity/associated sxs/prior Treatment) HPI  Linda Moon is a 44 yo female presenting with multiple symptoms.  She states she again having vomiting after eating with associated loose stools beginning 6 days ago. She also reports some right upper quadrant pain after eating also. 2 days later she began having nasal congestion, runny nose, ear fullness and coughing. The vomiting and loose stools have continued till today. She reports she has only been drinking fluids because of the vomiting and she still vomits whatever she drinks.  She reports the vomit looks like what she drank but denies any bloody emesis or stools.  She also denies any chills, chest pain or shortness of breath.   History reviewed. No pertinent past medical history. Past Surgical History  Procedure Laterality Date  . Breast surgery     No family history on file. History  Substance Use Topics  . Smoking status: Never Smoker   . Smokeless tobacco: Not on file  . Alcohol Use: No   OB History    No data available     Review of Systems  Constitutional: Negative for fever and chills.  HENT: Positive for congestion. Negative for sore throat.   Eyes: Negative for visual disturbance.  Respiratory: Negative for cough and shortness of breath.   Cardiovascular: Negative for chest pain and leg swelling.  Gastrointestinal: Positive for nausea, vomiting, abdominal pain and diarrhea.  Genitourinary: Negative for dysuria.  Musculoskeletal: Negative for myalgias.  Skin: Negative for rash.  Neurological: Positive for headaches. Negative for weakness and numbness.      Allergies  Review of patient's allergies indicates no known allergies.  Home Medications   Prior to  Admission medications   Medication Sig Start Date End Date Taking? Authorizing Provider  ibuprofen (ADVIL,MOTRIN) 800 MG tablet Take 1 tablet (800 mg total) by mouth 3 (three) times daily. 05/16/14   Ladona MowJoe Mintz, PA-C   BP 149/96 mmHg  Pulse 105  Temp(Src) 98.1 F (36.7 C) (Oral)  Resp 18  Ht 5\' 4"  (1.626 m)  Wt 182 lb 9.6 oz (82.827 kg)  BMI 31.33 kg/m2  SpO2 98% Physical Exam  Constitutional: She appears well-developed and well-nourished. No distress.  HENT:  Head: Normocephalic and atraumatic.  Mouth/Throat: Oropharynx is clear and moist. No oropharyngeal exudate.  Eyes: Conjunctivae are normal.  Neck: Neck supple.  Cardiovascular: Normal rate, regular rhythm and intact distal pulses.   Pulmonary/Chest: Effort normal and breath sounds normal. No respiratory distress. She has no wheezes. She has no rales. She exhibits no tenderness.  Abdominal: Soft. Bowel sounds are normal. She exhibits no distension and no mass. There is no hepatosplenomegaly. There is tenderness in the right upper quadrant. There is positive Murphy's sign. There is no rigidity, no rebound, no guarding, no CVA tenderness and no tenderness at McBurney's point.    Musculoskeletal: She exhibits no tenderness.  Lymphadenopathy:    She has no cervical adenopathy.  Neurological: She is alert.  Skin: Skin is warm and dry. No rash noted. She is not diaphoretic.  Psychiatric: She has a normal mood and affect.  Nursing note and vitals reviewed.   ED Course  Procedures (including critical care time) Labs Review Labs Reviewed  CBC WITH DIFFERENTIAL/PLATELET - Abnormal;  Notable for the following:    Neutro Abs 8.1 (*)    All other components within normal limits  COMPREHENSIVE METABOLIC PANEL - Abnormal; Notable for the following:    Sodium 134 (*)    Glucose, Bld 127 (*)    AST 229 (*)    ALT 162 (*)    Total Bilirubin 1.3 (*)    All other components within normal limits  URINALYSIS, ROUTINE W REFLEX MICROSCOPIC  - Abnormal; Notable for the following:    Color, Urine AMBER (*)    APPearance CLOUDY (*)    Bilirubin Urine SMALL (*)    Ketones, ur 15 (*)    Protein, ur 100 (*)    Leukocytes, UA LARGE (*)    All other components within normal limits  URINE MICROSCOPIC-ADD ON - Abnormal; Notable for the following:    Squamous Epithelial / LPF MANY (*)    Bacteria, UA MANY (*)    All other components within normal limits  LIPASE, BLOOD  POC URINE PREG, ED    Imaging Review US Abdomen Limited Ruq  08/31/2014   CLINICAL DATA:  Right upper quadrant pain for 1 week  EXAM: US ABDOMEN LIMITED - RIGHT UPPER QUADRANT  COMPARISON:  None.  FINDINGS: Gallbladder:  No gallstones or wall thickening visualized. No sonographic Murphy sign noted.  Common bile duct:  Diameter: Normal caliber, 4 mm  Liver:  Heterogeneous, increased echotexture suggesting fatty infiltration. No focal abnormality or biliary ductal dilatation.  IMPRESSION: Diffuse fatty infiltration of the liver.  No cholelithiasis or acute cholecystitis.   Electronically Signed   By: Charlett Nose M.D.   On: 08/31/2014 15:08     EKG Interpretation None      MDM   Final diagnoses:  RUQ pain  URI, acute   44 yo with RUQ pain and vomiting followed by symptoms consistent with URI. She has large leukocytes and 21-50 WBC in her urine but no urinary symptoms. She also ketones in her urine consistent with vomiting and dehydration. She has elevated AST and ALT, 229 and 162 respectively. Her RUQ [place] doesn't show any gall stones but a fatty liver. Her symptoms improved after NS bolus and pain and nausea meds. She is well-appearing, in no acute distress and vital signs reviewed and not concerning. She appears safe to be discharged.  Discharge include follow-up with GI. Return precautions provided. Pt aware of plan and in agreement.     Filed Vitals:   08/31/14 1530 08/31/14 1545 08/31/14 1600 08/31/14 1608  BP: 137/88 146/89 142/91   Pulse: 75 80 82   Temp:     98.2 F (36.8 C)  TempSrc:    Oral  Resp:      Height:      Weight:      SpO2: 93% 97% 96%    Meds given in ED:  Medications  sodium chloride 0.9 % bolus 1,000 mL (0 mLs Intravenous Stopped 08/31/14 1355)  ketorolac (TORADOL) 30 MG/ML injection 30 mg (30 mg Intravenous Given 08/31/14 1140)  prochlorperazine (COMPAZINE) injection 10 mg (10 mg Intravenous Given 08/31/14 1141)  diphenhydrAMINE (BENADRYL) injection 25 mg (25 mg Intravenous Given 08/31/14 1140)  morphine 4 MG/ML injection 4 mg (4 mg Intravenous Given 08/31/14 1352)  sodium chloride 0.9 % bolus 1,000 mL (0 mLs Intravenous Stopped 08/31/14 1610)    Discharge Medication List as of 08/31/2014  3:49 PM    START taking these medications   Details  ondansetron (ZOFRAN) 4 MG tablet Take  1 tablet (4 mg total) by mouth every 6 (six) hours., Starting 08/31/2014, Until Discontinued, Print    Phenylephrine-DM-GG-APAP (MUCINEX FAST-MAX) 5-10-200-325 MG CAPS Take 2 capsules by mouth 3 (three) times daily., Starting 08/31/2014, Until Discontinued, Print           Harle Battiest, NP 09/01/14 1436  Vanetta Mulders, MD 09/02/14 1728

## 2014-08-31 NOTE — ED Notes (Signed)
Pt states that she has ear pain, nasal congestion, cough, migraine, and rt sided abdominal pain for 1 week. Pt reports n/v as well.

## 2015-10-31 ENCOUNTER — Emergency Department (HOSPITAL_COMMUNITY): Payer: Self-pay

## 2015-10-31 ENCOUNTER — Encounter (HOSPITAL_COMMUNITY): Payer: Self-pay

## 2015-10-31 ENCOUNTER — Emergency Department (HOSPITAL_COMMUNITY)
Admission: EM | Admit: 2015-10-31 | Discharge: 2015-10-31 | Disposition: A | Discharge status: Home / Self Care | Payer: Self-pay | Attending: Dermatology | Admitting: Dermatology

## 2015-10-31 ENCOUNTER — Other Ambulatory Visit: Payer: Self-pay

## 2015-10-31 DIAGNOSIS — R079 Chest pain, unspecified: Secondary | ICD-10-CM | POA: Insufficient documentation

## 2015-10-31 DIAGNOSIS — R1030 Lower abdominal pain, unspecified: Secondary | ICD-10-CM | POA: Insufficient documentation

## 2015-10-31 DIAGNOSIS — Z5321 Procedure and treatment not carried out due to patient leaving prior to being seen by health care provider: Secondary | ICD-10-CM | POA: Insufficient documentation

## 2015-10-31 DIAGNOSIS — N939 Abnormal uterine and vaginal bleeding, unspecified: Secondary | ICD-10-CM | POA: Insufficient documentation

## 2015-10-31 DIAGNOSIS — I10 Essential (primary) hypertension: Secondary | ICD-10-CM | POA: Insufficient documentation

## 2015-10-31 LAB — COMPREHENSIVE METABOLIC PANEL
ALK PHOS: 60 U/L (ref 38–126)
ALT: 128 U/L — ABNORMAL HIGH (ref 14–54)
ANION GAP: 13 (ref 5–15)
AST: 211 U/L — ABNORMAL HIGH (ref 15–41)
Albumin: 4.2 g/dL (ref 3.5–5.0)
BILIRUBIN TOTAL: 0.6 mg/dL (ref 0.3–1.2)
BUN: 6 mg/dL (ref 6–20)
CO2: 20 mmol/L — ABNORMAL LOW (ref 22–32)
Calcium: 9.5 mg/dL (ref 8.9–10.3)
Chloride: 104 mmol/L (ref 101–111)
Creatinine, Ser: 0.6 mg/dL (ref 0.44–1.00)
GFR calc non Af Amer: >60 mL/min (ref >60)
Glucose, Bld: 89 mg/dL (ref 65–99)
Potassium: 4.1 mmol/L (ref 3.5–5.1)
SODIUM: 137 mmol/L (ref 135–145)
TOTAL PROTEIN: 7.6 g/dL (ref 6.5–8.1)

## 2015-10-31 LAB — CBC
HCT: 37.5 % (ref 36.0–46.0)
HEMOGLOBIN: 12.4 g/dL (ref 12.0–15.0)
MCH: 28.1 pg (ref 26.0–34.0)
MCHC: 33.1 g/dL (ref 30.0–36.0)
MCV: 85 fL (ref 78.0–100.0)
Platelets: 276 10*3/uL (ref 150–400)
RBC: 4.41 MIL/uL (ref 3.87–5.11)
RDW: 13.4 % (ref 11.5–15.5)
WBC: 6.3 10*3/uL (ref 4.0–10.5)

## 2015-10-31 LAB — I-STAT TROPONIN, ED: TROPONIN I, POC: 0 ng/mL (ref 0.00–0.08)

## 2015-10-31 LAB — URINALYSIS, ROUTINE W REFLEX MICROSCOPIC
Bilirubin Urine: NEGATIVE
Glucose, UA: NEGATIVE mg/dL
Hgb urine dipstick: NEGATIVE
Ketones, ur: NEGATIVE mg/dL
Leukocytes, UA: NEGATIVE
NITRITE: NEGATIVE
Protein, ur: NEGATIVE mg/dL
SPECIFIC GRAVITY, URINE: 1.007 (ref 1.005–1.030)
pH: 6 (ref 5.0–8.0)

## 2015-10-31 LAB — LIPASE, BLOOD: Lipase: 55 U/L — ABNORMAL HIGH (ref 11–51)

## 2015-10-31 NOTE — ED Notes (Signed)
Per Pt, Pt is coming from work. Reports bleeding and spotting from her vagina for two weeks without completing her cycle. Continuous pain in her lower abdomen. Pt reports today, she started to have left chest pain that was a dull sore pain that was decreased by resting and sitting.

## 2015-11-01 ENCOUNTER — Inpatient Hospital Stay (HOSPITAL_COMMUNITY)
Admission: AD | Admit: 2015-11-01 | Discharge: 2015-11-01 | Disposition: A | Discharge status: Home / Self Care | Payer: Self-pay | Source: Ambulatory Visit | Attending: Family Medicine | Admitting: Family Medicine

## 2015-11-01 ENCOUNTER — Encounter (HOSPITAL_COMMUNITY): Payer: Self-pay | Admitting: *Deleted

## 2015-11-01 DIAGNOSIS — N72 Inflammatory disease of cervix uteri: Secondary | ICD-10-CM | POA: Insufficient documentation

## 2015-11-01 DIAGNOSIS — R748 Abnormal levels of other serum enzymes: Secondary | ICD-10-CM | POA: Insufficient documentation

## 2015-11-01 DIAGNOSIS — Z888 Allergy status to other drugs, medicaments and biological substances status: Secondary | ICD-10-CM | POA: Insufficient documentation

## 2015-11-01 DIAGNOSIS — N760 Acute vaginitis: Secondary | ICD-10-CM | POA: Insufficient documentation

## 2015-11-01 DIAGNOSIS — N939 Abnormal uterine and vaginal bleeding, unspecified: Secondary | ICD-10-CM

## 2015-11-01 DIAGNOSIS — I10 Essential (primary) hypertension: Secondary | ICD-10-CM | POA: Insufficient documentation

## 2015-11-01 DIAGNOSIS — T192XXA Foreign body in vulva and vagina, initial encounter: Secondary | ICD-10-CM

## 2015-11-01 DIAGNOSIS — R1030 Lower abdominal pain, unspecified: Secondary | ICD-10-CM

## 2015-11-01 HISTORY — DX: Headache: R51

## 2015-11-01 HISTORY — DX: Major depressive disorder, single episode, unspecified: F32.9

## 2015-11-01 HISTORY — DX: Depression, unspecified: F32.A

## 2015-11-01 HISTORY — DX: Headache, unspecified: R51.9

## 2015-11-01 HISTORY — DX: Essential (primary) hypertension: I10

## 2015-11-01 LAB — URINALYSIS, ROUTINE W REFLEX MICROSCOPIC
Bilirubin Urine: NEGATIVE
Glucose, UA: NEGATIVE mg/dL
Ketones, ur: NEGATIVE mg/dL
LEUKOCYTES UA: NEGATIVE
NITRITE: NEGATIVE
PROTEIN: NEGATIVE mg/dL
pH: 6 (ref 5.0–8.0)

## 2015-11-01 LAB — WET PREP, GENITAL
Sperm: NONE SEEN
Trich, Wet Prep: NONE SEEN
Yeast Wet Prep HPF POC: NONE SEEN

## 2015-11-01 LAB — CBC
HCT: 38.3 % (ref 36.0–46.0)
Hemoglobin: 13.1 g/dL (ref 12.0–15.0)
MCH: 29 pg (ref 26.0–34.0)
MCHC: 34.2 g/dL (ref 30.0–36.0)
MCV: 84.9 fL (ref 78.0–100.0)
Platelets: 271 10*3/uL (ref 150–400)
RBC: 4.51 MIL/uL (ref 3.87–5.11)
RDW: 13.7 % (ref 11.5–15.5)
WBC: 7.2 10*3/uL (ref 4.0–10.5)

## 2015-11-01 LAB — POCT PREGNANCY, URINE: Preg Test, Ur: NEGATIVE

## 2015-11-01 LAB — URINE MICROSCOPIC-ADD ON: WBC UA: NONE SEEN WBC/hpf (ref 0–5)

## 2015-11-01 MED ORDER — METRONIDAZOLE 500 MG PO TABS: 500.0000 mg | ORAL_TABLET | Freq: Two times a day (BID) | ORAL | Status: DC

## 2015-11-01 MED ORDER — HYDROCHLOROTHIAZIDE 25 MG PO TABS
25.0000 mg | ORAL_TABLET | Freq: Every day | ORAL | Status: DC
  Administered 2015-11-01: 25 mg via ORAL
  Filled 2015-11-01 (×2): qty 1

## 2015-11-01 MED ORDER — AZITHROMYCIN 250 MG PO TABS
1000.0000 mg | ORAL_TABLET | Freq: Once | ORAL | Status: AC
  Administered 2015-11-01: 1000 mg via ORAL
  Filled 2015-11-01: qty 4

## 2015-11-01 MED ORDER — KETOROLAC TROMETHAMINE 60 MG/2ML IM SOLN
60.0000 mg | Freq: Once | INTRAMUSCULAR | Status: AC
  Administered 2015-11-01: 60 mg via INTRAMUSCULAR
  Filled 2015-11-01: qty 2

## 2015-11-01 MED ORDER — CEFTRIAXONE SODIUM 250 MG IJ SOLR
250.0000 mg | Freq: Once | INTRAMUSCULAR | Status: AC
  Administered 2015-11-01: 250 mg via INTRAMUSCULAR
  Filled 2015-11-01: qty 250

## 2015-11-01 MED ORDER — HYDROCHLOROTHIAZIDE 25 MG PO TABS: 25.0000 mg | ORAL_TABLET | Freq: Every day | ORAL | Status: AC

## 2015-11-01 NOTE — MAU Note (Signed)
Has been bleeding for 2 wks,, started when expected period.  Had stopped, then started again when intimate, now spotting.  Painful intercourse, pain in lower abd.

## 2015-11-01 NOTE — MAU Note (Signed)
Urine in lab 

## 2015-11-01 NOTE — MAU Note (Signed)
[facility] Waited "[facility]" and was told she was #19 in line so she left because she had her 44 year old daughter with her.

## 2015-11-01 NOTE — MAU Note (Signed)
Supposed to by on BP med, can not afford it

## 2015-11-01 NOTE — MAU Provider Note (Signed)
History     CSN: [id number]  Arrival date and time: 11/01/15 1105   First Provider Initiated Contact with Patient 11/01/15 1309      Chief Complaint  Patient presents with  . Vaginal Bleeding  . Abdominal Pain   HPI  Pt is 45 yo AA not pregnant [id number] who presents with lower abdominal pain and abnormal uterine bleeding. Pt states she has had RCM until last period and has lasted for 2 weeks.  Pt also had pain with IC last encounter- with fiance who has been together with pt for 1 year. Pt denies constipation or diarrhea, chills or fever. Pt has not taken any medications.  Pt states that ibuprofen makes her have nausea and vomiting. Pt has hx of hypertension- not on medications today Pt has not had pap smear recently due to no insurance Review of records indicate that pt has elevated liver enzymes-[facility] and labs drawn but pt did not wait to be seen.  Pt was told she had high blood pressure.  A previous note stated that pt needed to have GI consult.  Pt has not had a GI consult.    Past Medical History  Diagnosis Date  . Depression     postpartum  . Headache     migraine  . Hypertension     Past Surgical History  Procedure Laterality Date  . Breast surgery      History reviewed. No pertinent family history.  Social History  Substance Use Topics  . Smoking status: Never Smoker   . Smokeless tobacco: None  . Alcohol Use: Yes     Comment: 1-2/day    Allergies:  Allergies  Allergen Reactions  . Ibuprofen Nausea Only    Prescriptions prior to admission  Medication Sig Dispense Refill Last Dose  . ibuprofen (ADVIL,MOTRIN) 800 MG tablet Take 1 tablet (800 mg total) by mouth 3 (three) times daily. (Patient not taking: Reported on 08/31/2014) 21 tablet 0 Completed Course at Unknown time  . ondansetron (ZOFRAN) 4 MG tablet Take 1 tablet (4 mg total) by mouth every 6 (six) hours. (Patient not taking: Reported on 11/01/2015) 12 tablet 0   .  Phenylephrine-DM-GG-APAP (MUCINEX FAST-MAX) 5-10-200-325 MG CAPS Take 2 capsules by mouth 3 (three) times daily. (Patient not taking: Reported on 11/01/2015) 60 capsule 0     Review of Systems  Constitutional: Negative for fever and chills.  Gastrointestinal: Positive for abdominal pain. Negative for nausea, vomiting, diarrhea and constipation.  Genitourinary: Negative for dysuria.  Neurological: Negative for headaches.   Physical Exam   Blood pressure 159/101, pulse 97, temperature 98 F (36.7 C), temperature source Oral, resp. rate 18, weight 166 lb 12 oz (75.637 kg), last menstrual period 10/16/2015.  Physical Exam  Vitals reviewed. Constitutional: She is oriented to person, place, and time. She appears well-developed and well-nourished. No distress.  HENT:  Head: Normocephalic.  Eyes: Pupils are equal, round, and reactive to light.  Neck: Normal range of motion. Neck supple.  Cardiovascular: Normal rate.   Respiratory: Effort normal.  GI: Soft. She exhibits no distension. There is no tenderness. There is no rebound and no guarding.  Genitourinary:  Retained tampon removed with ring forceps intact without difficulty.  Some erosion noted on posterior cervix 6:30- minimal bleeding.  No bleeding noted in vagina or from cervix. Cervix tender with manipulation and right adnexa mildly tender without palpable enlargement- uterus and left adnexa diffusely tender- no rebound.  Musculoskeletal: Normal range of  motion.  Neurological: She is alert and oriented to person, place, and time.  Skin: Skin is warm and dry.    MAU Course  Procedures Results for orders placed or performed during the hospital encounter of 11/01/15 (from the past 24 hour(s))  Pregnancy, urine POC     Status: None   Collection Time: 11/01/15 11:20 AM  Result Value Ref Range   Preg Test, Ur NEGATIVE NEGATIVE  CBC     Status: None   Collection Time: 11/01/15 12:22 PM  Result Value Ref Range   WBC 7.2 4.0 - 10.5  K/uL   RBC 4.51 3.87 - 5.11 MIL/uL   Hemoglobin 13.1 12.0 - 15.0 g/dL   HCT 16.138.3 09.636.0 - 04.546.0 %   MCV 84.9 78.0 - 100.0 fL   MCH 29.0 26.0 - 34.0 pg   MCHC 34.2 30.0 - 36.0 g/dL   RDW 40.913.7 81.111.5 - 91.415.5 %   Platelets 271 150 - 400 K/uL   Results for orders placed or performed during the hospital encounter of 11/01/15 (from the past 24 hour(s))  Urinalysis, Routine w reflex microscopic (not at [facility])     Status: Abnormal   Collection Time: 11/01/15 11:10 AM  Result Value Ref Range   Color, Urine YELLOW YELLOW   APPearance CLEAR CLEAR   Specific Gravity, Urine <1.005 (L) 1.005 - 1.030   pH 6.0 5.0 - 8.0   Glucose, UA NEGATIVE NEGATIVE mg/dL   Hgb urine dipstick TRACE (A) NEGATIVE   Bilirubin Urine NEGATIVE NEGATIVE   Ketones, ur NEGATIVE NEGATIVE mg/dL   Protein, ur NEGATIVE NEGATIVE mg/dL   Nitrite NEGATIVE NEGATIVE   Leukocytes, UA NEGATIVE NEGATIVE  Urine microscopic-add on     Status: Abnormal   Collection Time: 11/01/15 11:10 AM  Result Value Ref Range   Squamous Epithelial / LPF 0-5 (A) NONE SEEN   WBC, UA NONE SEEN 0 - 5 WBC/hpf   RBC / HPF 0-5 0 - 5 RBC/hpf   Bacteria, UA RARE (A) NONE SEEN  Pregnancy, urine POC     Status: None   Collection Time: 11/01/15 11:20 AM  Result Value Ref Range   Preg Test, Ur NEGATIVE NEGATIVE  CBC     Status: None   Collection Time: 11/01/15 12:22 PM  Result Value Ref Range   WBC 7.2 4.0 - 10.5 K/uL   RBC 4.51 3.87 - 5.11 MIL/uL   Hemoglobin 13.1 12.0 - 15.0 g/dL   HCT 78.238.3 95.636.0 - 21.346.0 %   MCV 84.9 78.0 - 100.0 fL   MCH 29.0 26.0 - 34.0 pg   MCHC 34.2 30.0 - 36.0 g/dL   RDW 08.613.7 57.811.5 - 46.915.5 %   Platelets 271 150 - 400 K/uL  Wet prep, genital     Status: Abnormal   Collection Time: 11/01/15  1:24 PM  Result Value Ref Range   Yeast Wet Prep HPF POC NONE SEEN NONE SEEN   Trich, Wet Prep NONE SEEN NONE SEEN   Clue Cells Wet Prep HPF POC PRESENT (A) NONE SEEN   WBC, Wet Prep HPF POC FEW (A) NONE SEEN   Sperm NONE SEEN   GC/chlamydia  pending Rocephin 250mg  IM and Zithromax 1 gm PO given for cervicitis Rx for Flagyl 500mg  BID for 7 days- pt to look at [facility] for cheapest price HCTZ 25mg  given for hypertension- importance of following up with provider discussed- [facility]; also [facility] Discussed with Dr. Shawnie PonsPratt  Pt need pap smear- free Pap clinic discussed with pt and sister   Assessment and Plan  Abnormal uterine bleeding- follow up with [facility] if continues Retained tampon- removed Cervicitis- Rocpe[place] and Zithromax given in MAU BV- Rx Flagyl  BID for 7 days Hypertension- Rx HCTZ #30- pt to f/u with PCP Elevated liver enzymes- stop NSAIDs; f/u with PCP Discussed with pt [facility]; sister goes to [facility]- sister will help pt get care  Estephania Licciardi 11/01/2015, 1:40 PM

## 2015-11-01 NOTE — Discharge Instructions (Signed)
Stop your Ibuprofen until you have further evaluation due to your high liver enzymes

## 2015-11-02 LAB — GC/CHLAMYDIA PROBE AMP (~~LOC~~) NOT AT ARMC
CHLAMYDIA, DNA PROBE: NEGATIVE
Neisseria Gonorrhea: NEGATIVE

## 2015-11-02 LAB — HIV ANTIBODY (ROUTINE TESTING W REFLEX): HIV Screen 4th Generation wRfx: NONREACTIVE

## 2017-07-03 ENCOUNTER — Emergency Department (HOSPITAL_COMMUNITY)
Admission: EM | Admit: 2017-07-03 | Discharge: 2017-07-03 | Disposition: A | Discharge status: Home / Self Care | Payer: Self-pay | Attending: Emergency Medicine | Admitting: Emergency Medicine

## 2017-07-03 ENCOUNTER — Encounter (HOSPITAL_COMMUNITY): Payer: Self-pay | Admitting: *Deleted

## 2017-07-03 ENCOUNTER — Emergency Department (HOSPITAL_COMMUNITY): Payer: Self-pay

## 2017-07-03 ENCOUNTER — Other Ambulatory Visit: Payer: Self-pay

## 2017-07-03 DIAGNOSIS — I10 Essential (primary) hypertension: Secondary | ICD-10-CM | POA: Insufficient documentation

## 2017-07-03 DIAGNOSIS — Z7289 Other problems related to lifestyle: Secondary | ICD-10-CM

## 2017-07-03 DIAGNOSIS — Z789 Other specified health status: Secondary | ICD-10-CM

## 2017-07-03 DIAGNOSIS — R102 Pelvic and perineal pain: Secondary | ICD-10-CM

## 2017-07-03 DIAGNOSIS — N83201 Unspecified ovarian cyst, right side: Secondary | ICD-10-CM | POA: Insufficient documentation

## 2017-07-03 DIAGNOSIS — N390 Urinary tract infection, site not specified: Secondary | ICD-10-CM | POA: Insufficient documentation

## 2017-07-03 DIAGNOSIS — Z79899 Other long term (current) drug therapy: Secondary | ICD-10-CM | POA: Insufficient documentation

## 2017-07-03 DIAGNOSIS — N938 Other specified abnormal uterine and vaginal bleeding: Secondary | ICD-10-CM | POA: Insufficient documentation

## 2017-07-03 LAB — URINALYSIS, ROUTINE W REFLEX MICROSCOPIC
Bilirubin Urine: NEGATIVE
Glucose, UA: NEGATIVE mg/dL
Ketones, ur: NEGATIVE mg/dL
Leukocytes, UA: NEGATIVE
NITRITE: NEGATIVE
PROTEIN: NEGATIVE mg/dL
Specific Gravity, Urine: 1.015 (ref 1.005–1.030)
pH: 5 (ref 5.0–8.0)

## 2017-07-03 LAB — COMPREHENSIVE METABOLIC PANEL
ALT: 50 U/L (ref 14–54)
ANION GAP: 13 (ref 5–15)
AST: 130 U/L — ABNORMAL HIGH (ref 15–41)
Albumin: 3.8 g/dL (ref 3.5–5.0)
Alkaline Phosphatase: 44 U/L (ref 38–126)
BUN: 7 mg/dL (ref 6–20)
CO2: 22 mmol/L (ref 22–32)
CREATININE: 0.71 mg/dL (ref 0.44–1.00)
Calcium: 9 mg/dL (ref 8.9–10.3)
Chloride: 99 mmol/L — ABNORMAL LOW (ref 101–111)
GFR calc non Af Amer: >60 mL/min (ref >60)
Glucose, Bld: 130 mg/dL — ABNORMAL HIGH (ref 65–99)
Potassium: 4.1 mmol/L (ref 3.5–5.1)
Sodium: 134 mmol/L — ABNORMAL LOW (ref 135–145)
Total Bilirubin: 0.8 mg/dL (ref 0.3–1.2)
Total Protein: 6.9 g/dL (ref 6.5–8.1)

## 2017-07-03 LAB — CBC WITH DIFFERENTIAL/PLATELET
Basophils Absolute: 0 10*3/uL (ref 0.0–0.1)
Basophils Relative: 1 %
EOS PCT: 2 %
Eosinophils Absolute: 0.2 10*3/uL (ref 0.0–0.7)
HCT: 37.8 % (ref 36.0–46.0)
Hemoglobin: 12.2 g/dL (ref 12.0–15.0)
LYMPHS ABS: 1.2 10*3/uL (ref 0.7–4.0)
LYMPHS PCT: 17 %
MCH: 28.2 pg (ref 26.0–34.0)
MCHC: 32.3 g/dL (ref 30.0–36.0)
MCV: 87.5 fL (ref 78.0–100.0)
MONO ABS: 0.6 10*3/uL (ref 0.1–1.0)
MONOS PCT: 8 %
Neutro Abs: 5.1 10*3/uL (ref 1.7–7.7)
Neutrophils Relative %: 72 %
PLATELETS: 308 10*3/uL (ref 150–400)
RBC: 4.32 MIL/uL (ref 3.87–5.11)
RDW: 15.7 % — AB (ref 11.5–15.5)
WBC: 7 10*3/uL (ref 4.0–10.5)

## 2017-07-03 LAB — WET PREP, GENITAL
SPERM: NONE SEEN
TRICH WET PREP: NONE SEEN
YEAST WET PREP: NONE SEEN

## 2017-07-03 LAB — I-STAT BETA HCG BLOOD, ED (MC, WL, AP ONLY): I-stat hCG, quantitative: <5 m[IU]/mL (ref <5)

## 2017-07-03 LAB — LIPASE, BLOOD: Lipase: 66 U/L — ABNORMAL HIGH (ref 11–51)

## 2017-07-03 MED ORDER — CEPHALEXIN 500 MG PO CAPS: ORAL_CAPSULE | ORAL | 0 refills | Status: DC

## 2017-07-03 MED ORDER — SODIUM CHLORIDE 0.9 % IV SOLN
1.0000 g | Freq: Once | INTRAVENOUS | Status: AC
  Administered 2017-07-03: 1 g via INTRAVENOUS
  Filled 2017-07-03: qty 10

## 2017-07-03 MED ORDER — MORPHINE SULFATE (PF) 4 MG/ML IV SOLN
4.0000 mg | Freq: Once | INTRAVENOUS | Status: AC
  Administered 2017-07-03: 4 mg via INTRAVENOUS
  Filled 2017-07-03: qty 1

## 2017-07-03 MED ORDER — HYDROCHLOROTHIAZIDE 25 MG PO TABS: 25.0000 mg | ORAL_TABLET | Freq: Every day | ORAL | 0 refills | Status: AC

## 2017-07-03 MED ORDER — HYDROCHLOROTHIAZIDE 25 MG PO TABS
25.0000 mg | ORAL_TABLET | Freq: Every day | ORAL | Status: DC
  Administered 2017-07-03: 25 mg via ORAL
  Filled 2017-07-03: qty 1

## 2017-07-03 NOTE — ED Triage Notes (Signed)
C/o vaginal bleeding on and off x 3 weeks. C/o headache for several days.

## 2017-07-03 NOTE — ED Notes (Signed)
D/c reviewed with patient. Patient states "I have no way of getting this medicine, I can ask my dad" RN contacts social worker and case management for assistance with medication.

## 2017-07-03 NOTE — ED Provider Notes (Signed)
[facility]   CSN: [id number] Arrival date & time: 07/03/17  0849     History   Chief Complaint Chief Complaint  Patient presents with  . Vaginal Bleeding    HPI Linda Moon is a 47 y.o. female with a PMHx of HTN, migraines, and depression, who presents to the ED with complaints of vaginal bleeding x 2 weeks.  Patient states that 3 weeks ago she had her menstrual cycle for 1 week, which was her typical menstrual cycle, and then she had no menstrual bleeding for 1 day but then the subsequent day began spotting and has been spotting ever since.  She states she usually has very regular menses.  She reports that the spotting is dark medium red blood with no passage of clots at this time.  She also reports associated lateral abdominal pain as well as dysuria.  She describes the pain as 8/10 constant aching nonradiating bilateral lateral abdominal pain that worsens when she lays on her back and has been unrelieved with Tylenol and heat pad use.  She endorses drinking a couple beers daily.  She denies fevers, chills, CP, SOB, nausea/vomiting, diarrhea/constipation, obstipation, melena, hematochezia, hematuria, urinary frequency, vaginal itching/discharge, myalgias, arthralgias, numbness, tingling, focal weakness, or any other complaints at this time. Denies recent travel, sick contacts, suspicious food intake, NSAID use, or prior abd surgeries.  Of note, she has not been on her HCTZ 25mg  in "a while". Does not have a PCP.    The history is provided by the patient and medical records. No language interpreter was used.  Vaginal Bleeding  Primary symptoms include dysuria, and vaginal bleeding.  Primary symptoms include no discharge, no genital itching. There has been no fever. This is a new problem. The current episode started more than 1 week ago. The problem occurs daily. The problem has not changed since onset.The symptoms occur spontaneously. She  is not pregnant. She has not missed her period. Her LMP was weeks ago. The patient's menstrual history has been regular. Associated symptoms include abdominal pain. Pertinent negatives include no constipation, no diarrhea, no nausea, no vomiting and no frequency. She has tried nothing for the symptoms. The treatment provided no relief.    Past Medical History:  Diagnosis Date  . Depression    postpartum  . Headache    migraine  . Hypertension     There are no active problems to display for this patient.   Past Surgical History:  Procedure Laterality Date  . BREAST SURGERY      OB History    Gravida Para Term Preterm AB Living   5 3 3   2 3    SAB TAB Ectopic Multiple Live Births   1 1             Home Medications    Prior to Admission medications   Medication Sig Start Date End Date Taking? Authorizing Provider  hydrochlorothiazide (HYDRODIURIL) 25 MG tablet Take 1 tablet (25 mg total) by mouth daily. 11/01/15   Jean RosenthalLineberry, Susan P, NP  metroNIDAZOLE (FLAGYL) 500 MG tablet Take 1 tablet (500 mg total) by mouth 2 (two) times daily. 11/01/15   Jean RosenthalLineberry, Susan P, NP    Family History No family history on file.  Social History Social History   Tobacco Use  . Smoking status: Never Smoker  . Smokeless tobacco: Never Used  Substance Use Topics  . Alcohol use: Yes    Comment: 1-2/day  . Drug  use: No     Allergies   Ibuprofen   Review of Systems Review of Systems  Constitutional: Negative for chills and fever.  Respiratory: Negative for shortness of breath.   Cardiovascular: Negative for chest pain.  Gastrointestinal: Positive for abdominal pain. Negative for blood in stool, constipation, diarrhea, nausea and vomiting.  Genitourinary: Positive for dysuria and vaginal bleeding. Negative for frequency, hematuria, vaginal discharge and vaginal pain.  Musculoskeletal: Negative for arthralgias and myalgias.  Skin: Negative for color change.  Allergic/Immunologic:  Negative for immunocompromised state.  Neurological: Negative for weakness and numbness.  Psychiatric/Behavioral: Negative for confusion.   All other systems reviewed and are negative for acute change except as noted in the HPI.    Physical Exam Updated Vital Signs BP (!) 158/101   Pulse (!) 105   Temp 98.7 F (37.1 C) (Oral)   Resp 18   Ht 5\' 4"  (1.626 m)   Wt 72.6 kg (160 lb)   LMP 06/16/2017   SpO2 100%   BMI 27.46 kg/m   Physical Exam  Constitutional: She is oriented to person, place, and time. Vital signs are normal. She appears well-developed and well-nourished.  Non-toxic appearance. No distress.  Afebrile, nontoxic, NAD, HTN noted similar to prior visits  HENT:  Head: Normocephalic and atraumatic.  Mouth/Throat: Oropharynx is clear and moist and mucous membranes are normal.  Eyes: Conjunctivae and EOM are normal. Right eye exhibits no discharge. Left eye exhibits no discharge.  Neck: Normal range of motion. Neck supple.  Cardiovascular: Normal rate, regular rhythm, normal heart sounds and intact distal pulses. Exam reveals no gallop and no friction rub.  No murmur heard. HR 60-80s during exam  Pulmonary/Chest: Effort normal and breath sounds normal. No respiratory distress. She has no decreased breath sounds. She has no wheezes. She has no rhonchi. She has no rales.  Abdominal: Soft. Normal appearance and bowel sounds are normal. She exhibits no distension. There is tenderness in the right upper quadrant, right lower quadrant, left upper quadrant and left lower quadrant. There is no rigidity, no rebound, no guarding, no CVA tenderness, no tenderness at McBurney's point and negative Murphy's sign.    Soft, nondistended, +BS throughout, with mild b/l lateral abdominal wall TTP diffusely along the lateral abdomen, no r/g/r, neg murphy's, neg mcburney's, no CVA TTP   Genitourinary: Pelvic exam was performed with patient supine. There is no rash, tenderness or lesion on the  right labia. There is no rash, tenderness or lesion on the left labia. Uterus is tender. Uterus is not deviated, not enlarged and not fixed. Cervix exhibits no motion tenderness, no discharge and no friability. Right adnexum displays tenderness. Right adnexum displays no mass and no fullness. Left adnexum displays tenderness. Left adnexum displays no mass and no fullness. There is bleeding in the vagina. No erythema or tenderness in the vagina. No vaginal discharge found.  Genitourinary Comments: Chaperone present for exam. No rashes, lesions, or tenderness to external genitalia. No erythema, injury, or tenderness to vaginal mucosa. No vaginal discharge within vaginal vault; mild amount of dark red blood pooled in vaginal vault, coming from cervix. No adnexal masses or fullness appreciated, but diffuse b/l adnexal TTP. No CMT, cervical friability, or discharge from cervical os. Cervical os is closed. Uterus non-deviated, mobile, and without definite enlargement although body habitus slightly limits this evaluation; diffuse uterine tenderness during pelvic exam.   Musculoskeletal: Normal range of motion.  Neurological: She is alert and oriented to person, place, and time. She has  normal strength. No sensory deficit.  Skin: Skin is warm, dry and intact. No rash noted.  Psychiatric: She has a normal mood and affect.  Nursing note and vitals reviewed.    ED Treatments / Results  Labs (all labs ordered are listed, but only abnormal results are displayed) Labs Reviewed  WET PREP, GENITAL - Abnormal; Notable for the following components:      Result Value   Clue Cells Wet Prep HPF POC PRESENT (*)    WBC, Wet Prep HPF POC FEW (*)    All other components within normal limits  CBC WITH DIFFERENTIAL/PLATELET - Abnormal; Notable for the following components:   RDW 15.7 (*)    All other components within normal limits  URINALYSIS, ROUTINE W REFLEX MICROSCOPIC - Abnormal; Notable for the following  components:   APPearance HAZY (*)    Hgb urine dipstick MODERATE (*)    Bacteria, UA MANY (*)    Squamous Epithelial / LPF 0-5 (*)    All other components within normal limits  COMPREHENSIVE METABOLIC PANEL - Abnormal; Notable for the following components:   Sodium 134 (*)    Chloride 99 (*)    Glucose, Bld 130 (*)    AST 130 (*)    All other components within normal limits  LIPASE, BLOOD - Abnormal; Notable for the following components:   Lipase 66 (*)    All other components within normal limits  RPR  HIV ANTIBODY (ROUTINE TESTING)  I-STAT BETA HCG BLOOD, ED ([facility], [facility], [facility] ONLY)  GC/CHLAMYDIA PROBE AMP ([place]) NOT AT [facility]    EKG  EKG Interpretation None       Radiology US Pelvis Transvanginal Non-ob (tv Only)  Result Date: 07/03/2017 CLINICAL DATA:  Pelvic pain, vaginal bleeding EXAM: TRANSABDOMINAL AND TRANSVAGINAL ULTRASOUND OF PELVIS DOPPLER ULTRASOUND OF OVARIES TECHNIQUE: Both transabdominal and transvaginal ultrasound examinations of the pelvis were performed. Transabdominal technique was performed for global imaging of the pelvis including uterus, ovaries, adnexal regions, and pelvic cul-de-sac. It was necessary to proceed with endovaginal exam following the transabdominal exam to visualize the endometrium and ovaries. Color and duplex Doppler ultrasound was utilized to evaluate blood flow to the ovaries. COMPARISON:  None. FINDINGS: Uterus Measurements: 9.6 x 5.4 x 6.2 cm. No fibroids or other mass visualized. Endometrium Thickness: 9.1 mm.  No focal abnormality visualized. Right ovary Measurements: 5.2 x 3.4 x 4.2 cm. 3.2 x 3.5 x 3.1 cm anechoic right ovarian mass without internal Doppler flow and increased through transmission most consistent with a cyst. Left ovary Not visualized. Pulsed Doppler evaluation of both ovaries demonstrates normal low-resistance arterial and venous waveforms. Other findings No abnormal free fluid. IMPRESSION: 1. No sonographic findings to  explain the patient's vaginal bleeding. If bleeding remains unresponsive to hormonal or medical therapy, sonohysterogram should be considered for focal lesion work-up. (Ref: Radiological Reasoning: Algorithmic Workup of Abnormal Vaginal Bleeding with Endovaginal Sonography and Sonohysterography. AJR 2008; 098:J19-14) 2. Small right ovarian cyst. This is almost certainly benign, and no specific imaging follow up is recommended according to the Society of Radiologists in Ultrasound2010 Consensus Conference Statement (D Lenis Noon et al. Management of Asymptomatic Ovarian and Other Adnexal Cysts Imaged at [place]: Society of Radiologists in Ultrasound Consensus Conference Statement 2010. Radiology 256 (Sept 2010): 943-954.). Electronically Signed   By: Elige Ko   On: 07/03/2017 14:30   US Pelvis (transabdominal Only)  Result Date: 07/03/2017 CLINICAL DATA:  Pelvic pain, vaginal bleeding EXAM: TRANSABDOMINAL AND TRANSVAGINAL ULTRASOUND OF PELVIS DOPPLER  ULTRASOUND OF OVARIES TECHNIQUE: Both transabdominal and transvaginal ultrasound examinations of the pelvis were performed. Transabdominal technique was performed for global imaging of the pelvis including uterus, ovaries, adnexal regions, and pelvic cul-de-sac. It was necessary to proceed with endovaginal exam following the transabdominal exam to visualize the endometrium and ovaries. Color and duplex Doppler ultrasound was utilized to evaluate blood flow to the ovaries. COMPARISON:  None. FINDINGS: Uterus Measurements: 9.6 x 5.4 x 6.2 cm. No fibroids or other mass visualized. Endometrium Thickness: 9.1 mm.  No focal abnormality visualized. Right ovary Measurements: 5.2 x 3.4 x 4.2 cm. 3.2 x 3.5 x 3.1 cm anechoic right ovarian mass without internal Doppler flow and increased through transmission most consistent with a cyst. Left ovary Not visualized. Pulsed Doppler evaluation of both ovaries demonstrates normal low-resistance arterial and venous waveforms. Other findings  No abnormal free fluid. IMPRESSION: 1. No sonographic findings to explain the patient's vaginal bleeding. If bleeding remains unresponsive to hormonal or medical therapy, sonohysterogram should be considered for focal lesion work-up. (Ref: Radiological Reasoning: Algorithmic Workup of Abnormal Vaginal Bleeding with Endovaginal Sonography and Sonohysterography. AJR 2008; 161:W96-04) 2. Small right ovarian cyst. This is almost certainly benign, and no specific imaging follow up is recommended according to the Society of Radiologists in Ultrasound2010 Consensus Conference Statement (D Lenis Noon et al. Management of Asymptomatic Ovarian and Other Adnexal Cysts Imaged at [place]: Society of Radiologists in Ultrasound Consensus Conference Statement 2010. Radiology 256 (Sept 2010): 943-954.). Electronically Signed   By: Elige Ko   On: 07/03/2017 14:30   US Pelvic Doppler (torsion R/o Or Mass Arterial Flow)  Result Date: 07/03/2017 CLINICAL DATA:  Pelvic pain, vaginal bleeding EXAM: TRANSABDOMINAL AND TRANSVAGINAL ULTRASOUND OF PELVIS DOPPLER ULTRASOUND OF OVARIES TECHNIQUE: Both transabdominal and transvaginal ultrasound examinations of the pelvis were performed. Transabdominal technique was performed for global imaging of the pelvis including uterus, ovaries, adnexal regions, and pelvic cul-de-sac. It was necessary to proceed with endovaginal exam following the transabdominal exam to visualize the endometrium and ovaries. Color and duplex Doppler ultrasound was utilized to evaluate blood flow to the ovaries. COMPARISON:  None. FINDINGS: Uterus Measurements: 9.6 x 5.4 x 6.2 cm. No fibroids or other mass visualized. Endometrium Thickness: 9.1 mm.  No focal abnormality visualized. Right ovary Measurements: 5.2 x 3.4 x 4.2 cm. 3.2 x 3.5 x 3.1 cm anechoic right ovarian mass without internal Doppler flow and increased through transmission most consistent with a cyst. Left ovary Not visualized. Pulsed Doppler evaluation of both  ovaries demonstrates normal low-resistance arterial and venous waveforms. Other findings No abnormal free fluid. IMPRESSION: 1. No sonographic findings to explain the patient's vaginal bleeding. If bleeding remains unresponsive to hormonal or medical therapy, sonohysterogram should be considered for focal lesion work-up. (Ref: Radiological Reasoning: Algorithmic Workup of Abnormal Vaginal Bleeding with Endovaginal Sonography and Sonohysterography. AJR 2008; 540:J81-19) 2. Small right ovarian cyst. This is almost certainly benign, and no specific imaging follow up is recommended according to the Society of Radiologists in Ultrasound2010 Consensus Conference Statement (D Lenis Noon et al. Management of Asymptomatic Ovarian and Other Adnexal Cysts Imaged at [place]: Society of Radiologists in Ultrasound Consensus Conference Statement 2010. Radiology 256 (Sept 2010): 943-954.). Electronically Signed   By: Elige Ko   On: 07/03/2017 14:30    Procedures Procedures (including critical care time)  Medications Ordered in ED Medications  hydrochlorothiazide (HYDRODIURIL) tablet 25 mg (25 mg Oral Given 07/03/17 1327)  cefTRIAXone (ROCEPHIN) 1 g in sodium chloride 0.9 % 100 mL IVPB (1 g  Intravenous New Bag/Given 07/03/17 1332)  morphine 4 MG/ML injection 4 mg (4 mg Intravenous Given 07/03/17 1332)     Initial Impression / Assessment and Plan / ED Course  I have reviewed the triage vital signs and the nursing notes.  Pertinent labs & imaging results that were available during my care of the patient were reviewed by me and considered in my medical decision making (see chart for details).     47 y.o. female here with vaginal bleeding x2 wks since having her menses 3wks ago. Also has b/l abdominal pain on the lateral aspects of her abdomen, and some dysuria. On exam, mild TTP to lateral abdominal wall, nonperitoneal, no CVA TTP; HTN noted. Work up thus far shows: betaHCG neg; U/A with many bacteria and 0-5 squamous and  WBCs, given symptoms could be UTI, will treat as such; CBC WNL. Will perform pelvic exam to determine if any additional imaging is necessary, will get STD check and add on CMP and lipase. Will give her BP meds and pain meds and reassess shortly.   1:22 PM Pelvic exam reveals diffuse uterine and adnexal tenderness, mild amount of vaginal blood in vault coming from cervix, no CMT, no discharge. Will proceed with pelvic U/S and reassess shortly.   3:27 PM Pt feeling better. Lipase 66, although doubt pancreatitis given lack of clinical s/sx of such. CMP with marginally elevated gluc 130, AST 130 (similar to prior values, likely from chronic alcohol use), otherwise overall unremarkable. Wet prep with few WBCs and +clue cells, however no discharge on exam, doubt clinical significance, and doubt BV; doubt need for empiric GC/CT treatment either. Pelvic U/S without focal finding to explain vaginal bleeding; shows small R ovarian cyst. Overall, could be dysfunctional uterine bleeding causing the spotting, and UTI causing the lateral abdominal pain. Will send home with abx for UTI, advised tylenol/motrin for pain, heat use, adequate hydration, and f/up with [facility] in 1wk for recheck and to establish medical care. HTN also noted, pt off her meds for quite some time, will restart her HCTZ (BP trending down after being given this here), advised avoidance of OTC things that would cause her BP to go up, DASH diet advised, and f/up with [facility] for ongoing management of this as well. Monitor BP at home as well, and keep a log to take to PCP's office when she goes. Doubt need for further emergent work up at this time. Lastly, EtOH cessation advised. I explained the diagnosis and have given explicit precautions to return to the ER including for any other new or worsening symptoms. The patient understands and accepts the medical plan as it's been dictated and I have answered their questions. Discharge instructions concerning home  care and prescriptions have been given. The patient is STABLE and is discharged to home in good condition.    Final Clinical Impressions(s) / ED Diagnoses   Final diagnoses:  Pelvic pain  Dysfunctional uterine bleeding  Acute lower UTI  Essential hypertension  Right ovarian cyst  Alcohol use    ED Discharge Orders        Ordered    hydrochlorothiazide (HYDRODIURIL) 25 MG tablet  Daily     07/03/17 1520    cephALEXin (KEFLEX) 500 MG capsule     07/03/17 [address], [place], [place] 07/03/17 1527    Rolland Porter, MD 07/04/17 2222

## 2017-07-03 NOTE — ED Notes (Signed)
Patient transported to Ultrasound 

## 2017-07-03 NOTE — Discharge Instructions (Signed)
Your ultrasound shows a small ovarian cyst, which is nothing to worry about and usually goes away on its own. Your bleeding is due to dysfunctional uterine bleeding; you will need to follow up with an OBGYN or primary care doctor for ongoing management of this.  You have been tested for gonorrhea, chlamydia, HIV, and syphilis; the lab will call you if any of your tests are abnormal. Do not engage in sexual intercourse until you find out about your results. Have all partners tested and treated before re-engaging in sexual activity. Follow up with the health department for any future STD concerns, treatment, testing, etc.  Your work up today shows that you have a urinary tract infection. Stay very well hydrated with plenty of water throughout the day. Take antibiotic until completed. May consider over-the-counter Pyridium for pain relief, but don't take this longer than 3 days, and be aware that it may turn your urine bright orange. This is a harmless side effect. Alternate between tylenol and motrin as needed for pain. Use heat to the areas of pain to help with pain control as well. Follow up with the [facility] in 1 week for recheck of ongoing symptoms and to establish medical care, but [facility]similar to [facility] version of the ER) for emergent changing or worsening of symptoms.  Also, your blood pressure was elevated today. Eat a low salt/low sodium diet, and take all of your home blood pressure medications as directed. Keep a log of your blood pressure readings from every morning and evening (making sure to give yourself at least 15 minutes of rest prior to checking it) and take it to your doctor's office at your next appointment for ongoing management of your blood pressure. Stay well hydrated and get plenty of rest. Avoid caffeine and other over the counter products that would make your blood pressure go up (such as decongestants, excedrin, etc). Follow up with the  wellness center for ongoing management of this issue as well.  Also, stop drinking alcohol!   Please seek immediate care if you develop the following: You develop back pain.  Your symptoms are no better, or worse in 3 days. There is severe back pain or lower abdominal pain.  You develop chills.  You have a fever.  There is nausea or vomiting.  There is continued burning or discomfort with urination.

## 2017-07-04 LAB — GC/CHLAMYDIA PROBE AMP (~~LOC~~) NOT AT ARMC
CHLAMYDIA, DNA PROBE: NEGATIVE
NEISSERIA GONORRHEA: NEGATIVE

## 2017-07-04 LAB — RPR: RPR: NONREACTIVE

## 2017-07-04 LAB — HIV ANTIBODY (ROUTINE TESTING W REFLEX): HIV SCREEN 4TH GENERATION: NONREACTIVE

## 2020-06-30 ENCOUNTER — Emergency Department (HOSPITAL_COMMUNITY): Payer: Self-pay

## 2020-06-30 ENCOUNTER — Other Ambulatory Visit: Payer: Self-pay

## 2020-06-30 ENCOUNTER — Encounter (HOSPITAL_COMMUNITY): Payer: Self-pay

## 2020-06-30 ENCOUNTER — Emergency Department (HOSPITAL_COMMUNITY)
Admission: EM | Admit: 2020-06-30 | Discharge: 2020-06-30 | Disposition: A | Discharge status: Home / Self Care | Payer: Self-pay | Attending: Emergency Medicine | Admitting: Emergency Medicine

## 2020-06-30 DIAGNOSIS — R109 Unspecified abdominal pain: Secondary | ICD-10-CM | POA: Insufficient documentation

## 2020-06-30 DIAGNOSIS — I10 Essential (primary) hypertension: Secondary | ICD-10-CM | POA: Insufficient documentation

## 2020-06-30 DIAGNOSIS — Z79899 Other long term (current) drug therapy: Secondary | ICD-10-CM | POA: Insufficient documentation

## 2020-06-30 DIAGNOSIS — M62830 Muscle spasm of back: Secondary | ICD-10-CM | POA: Insufficient documentation

## 2020-06-30 LAB — I-STAT BETA HCG BLOOD, ED (MC, WL, AP ONLY): I-stat hCG, quantitative: <5 m[IU]/mL (ref <5)

## 2020-06-30 LAB — CBC WITH DIFFERENTIAL/PLATELET
Abs Immature Granulocytes: 0.03 10*3/uL (ref 0.00–0.07)
Basophils Absolute: 0.1 10*3/uL (ref 0.0–0.1)
Basophils Relative: 1 %
Eosinophils Absolute: 0.1 10*3/uL (ref 0.0–0.5)
Eosinophils Relative: 1 %
HCT: 37.6 % (ref 36.0–46.0)
Hemoglobin: 11.5 g/dL — ABNORMAL LOW (ref 12.0–15.0)
Immature Granulocytes: 0 %
Lymphocytes Relative: 19 %
Lymphs Abs: 1.9 10*3/uL (ref 0.7–4.0)
MCH: 22.9 pg — ABNORMAL LOW (ref 26.0–34.0)
MCHC: 30.6 g/dL (ref 30.0–36.0)
MCV: 74.8 fL — ABNORMAL LOW (ref 80.0–100.0)
Monocytes Absolute: 0.6 10*3/uL (ref 0.1–1.0)
Monocytes Relative: 6 %
Neutro Abs: 7.1 10*3/uL (ref 1.7–7.7)
Neutrophils Relative %: 73 %
Platelets: 424 10*3/uL — ABNORMAL HIGH (ref 150–400)
RBC: 5.03 MIL/uL (ref 3.87–5.11)
RDW: 19 % — ABNORMAL HIGH (ref 11.5–15.5)
WBC: 9.8 10*3/uL (ref 4.0–10.5)
nRBC: 0 % (ref 0.0–0.2)

## 2020-06-30 LAB — COMPREHENSIVE METABOLIC PANEL
ALT: 52 U/L — ABNORMAL HIGH (ref 0–44)
AST: 86 U/L — ABNORMAL HIGH (ref 15–41)
Albumin: 4.7 g/dL (ref 3.5–5.0)
Alkaline Phosphatase: 50 U/L (ref 38–126)
Anion gap: 15 (ref 5–15)
BUN: 9 mg/dL (ref 6–20)
CO2: 19 mmol/L — ABNORMAL LOW (ref 22–32)
Calcium: 9.3 mg/dL (ref 8.9–10.3)
Chloride: 102 mmol/L (ref 98–111)
Creatinine, Ser: 0.65 mg/dL (ref 0.44–1.00)
GFR, Estimated: >60 mL/min (ref >60)
Glucose, Bld: 118 mg/dL — ABNORMAL HIGH (ref 70–99)
Potassium: 3.7 mmol/L (ref 3.5–5.1)
Sodium: 136 mmol/L (ref 135–145)
Total Bilirubin: 0.6 mg/dL (ref 0.3–1.2)
Total Protein: 8.8 g/dL — ABNORMAL HIGH (ref 6.5–8.1)

## 2020-06-30 LAB — URINALYSIS, ROUTINE W REFLEX MICROSCOPIC
Bilirubin Urine: NEGATIVE
Glucose, UA: NEGATIVE mg/dL
Hgb urine dipstick: NEGATIVE
Ketones, ur: 5 mg/dL — AB
Leukocytes,Ua: NEGATIVE
Nitrite: NEGATIVE
Protein, ur: NEGATIVE mg/dL
Specific Gravity, Urine: >1.046 — ABNORMAL HIGH (ref 1.005–1.030)
pH: 5 (ref 5.0–8.0)

## 2020-06-30 LAB — LACTIC ACID, PLASMA
Lactic Acid, Venous: 3 mmol/L (ref 0.5–1.9)
Lactic Acid, Venous: 2.4 mmol/L (ref 0.5–1.9)

## 2020-06-30 LAB — LIPASE, BLOOD: Lipase: 39 U/L (ref 11–51)

## 2020-06-30 MED ORDER — SODIUM CHLORIDE 0.9 % IV BOLUS
1000.0000 mL | Freq: Once | INTRAVENOUS | Status: AC
  Administered 2020-06-30: 1000 mL via INTRAVENOUS

## 2020-06-30 MED ORDER — ONDANSETRON HCL 4 MG/2ML IJ SOLN
4.0000 mg | Freq: Once | INTRAMUSCULAR | Status: AC
  Administered 2020-06-30: 4 mg via INTRAVENOUS
  Filled 2020-06-30: qty 2

## 2020-06-30 MED ORDER — CYCLOBENZAPRINE HCL 10 MG PO TABS: 10.0000 mg | ORAL_TABLET | Freq: Two times a day (BID) | ORAL | 0 refills | Status: AC | PRN

## 2020-06-30 MED ORDER — IOHEXOL 300 MG/ML  SOLN
100.0000 mL | Freq: Once | INTRAMUSCULAR | Status: AC | PRN
  Administered 2020-06-30: 100 mL via INTRAVENOUS

## 2020-06-30 MED ORDER — LIDOCAINE 5 % EX PTCH: 1.0000 | MEDICATED_PATCH | CUTANEOUS | 0 refills | Status: AC

## 2020-06-30 NOTE — ED Notes (Signed)
Pt upset and threatening to leave d/t length of time in ED, attempted to calm patient and ask her to wait and speak w MD, MD made aware

## 2020-06-30 NOTE — ED Triage Notes (Signed)
Patient c/o intermittent bilateral flank pain x 1 month. Patient denies N/v/d.

## 2020-06-30 NOTE — ED Notes (Signed)
Critical lab- lactic acid 3.0 06/30/20 0948 Dr. Rush Landmark made aware

## 2020-06-30 NOTE — ED Provider Notes (Signed)
[facility]-EMERGENCY DEPT Provider Note   CSN: [id number] Arrival date & time: 06/30/20  0865     History Chief Complaint  Patient presents with  . Flank Pain    KAMARII BUREN is a 50 y.o. female.  The history is provided by the patient and medical records. No language interpreter was used.  Flank Pain This is a new problem. The current episode started more than 1 week ago. The problem occurs constantly. The problem has not changed since onset.Associated symptoms include abdominal pain. Pertinent negatives include no chest pain, no headaches and no shortness of breath. Nothing aggravates the symptoms. Nothing relieves the symptoms. She has tried nothing for the symptoms. The treatment provided no relief.       Past Medical History:  Diagnosis Date  . Depression    postpartum  . Headache    migraine  . Hypertension     There are no problems to display for this patient.   Past Surgical History:  Procedure Laterality Date  . BREAST SURGERY       OB History    Gravida  5   Para  3   Term  3   Preterm      AB  2   Living  3     SAB  1   IAB  1   Ectopic      Multiple      Live Births              Family History  Problem Relation Age of Onset  . Cancer Father     Social History   Tobacco Use  . Smoking status: Never Smoker  . Smokeless tobacco: Never Used  Vaping Use  . Vaping Use: Never used  Substance Use Topics  . Alcohol use: Yes    Comment: 1-2/day  . Drug use: Yes    Types: Cocaine    Comment: occasionally    Home Medications Prior to Admission medications   Medication Sig Start Date End Date Taking? Authorizing Provider  acetaminophen (TYLENOL) 325 MG tablet Take 650 mg by mouth every 6 (six) hours as needed for mild pain.    [provider]  cephALEXin (KEFLEX) 500 MG capsule 2 caps po bid x 7 days 07/03/17   Street, [place], PA-C  hydrochlorothiazide (HYDRODIURIL) 25 MG tablet Take 1 tablet  (25 mg total) by mouth daily. Patient not taking: Reported on 07/03/2017 11/01/15   Jean Rosenthal, NP  hydrochlorothiazide (HYDRODIURIL) 25 MG tablet Take 1 tablet (25 mg total) by mouth daily. 07/03/17   Street, [place], PA-C  metroNIDAZOLE (FLAGYL) 500 MG tablet Take 1 tablet (500 mg total) by mouth 2 (two) times daily. Patient not taking: Reported on 07/03/2017 11/01/15   Jean Rosenthal, NP    Allergies    Ibuprofen  Review of Systems   Review of Systems  Constitutional: Negative for chills, diaphoresis, fatigue and fever.  HENT: Negative for congestion.   Eyes: Negative for visual disturbance.  Respiratory: Negative for cough, chest tightness, shortness of breath and wheezing.   Cardiovascular: Negative for chest pain, palpitations and leg swelling.  Gastrointestinal: Positive for abdominal pain, nausea and vomiting. Negative for constipation and diarrhea.  Genitourinary: Positive for flank pain. Negative for dysuria.  Musculoskeletal: Positive for back pain. Negative for neck pain and neck stiffness.  Neurological: Negative for light-headedness and headaches.  Psychiatric/Behavioral: Negative for agitation and confusion.  All other systems reviewed and are negative.  Physical Exam Updated Vital Signs BP (!) 186/100 (BP Location: Left Arm)   Pulse (!) 110   Temp 98.4 F (36.9 C) (Oral)   Resp 16   Ht 5\' 4"  (1.626 m)   Wt 72.6 kg   LMP 03/30/2020   SpO2 100%   BMI 27.46 kg/m   Physical Exam Vitals and nursing note reviewed.  Constitutional:      General: She is not in acute distress.    Appearance: She is well-developed and well-nourished. She is not ill-appearing, toxic-appearing or diaphoretic.  HENT:     Head: Normocephalic and atraumatic.     Nose: No congestion or rhinorrhea.     Mouth/Throat:     Mouth: Mucous membranes are moist.     Pharynx: No oropharyngeal exudate or posterior oropharyngeal erythema.  Eyes:     Extraocular Movements: Extraocular  movements intact.     Conjunctiva/sclera: Conjunctivae normal.     Pupils: Pupils are equal, round, and reactive to light.  Cardiovascular:     Rate and Rhythm: Regular rhythm. Tachycardia present.     Heart sounds: No murmur heard.   Pulmonary:     Effort: Pulmonary effort is normal. No respiratory distress.     Breath sounds: Normal breath sounds. No wheezing, rhonchi or rales.  Chest:     Chest wall: No tenderness.  Abdominal:     General: Abdomen is flat.     Palpations: Abdomen is soft.     Tenderness: There is no abdominal tenderness. There is right CVA tenderness and left CVA tenderness. There is no guarding or rebound.  Musculoskeletal:        General: Tenderness present. No edema.     Cervical back: Neck supple. No tenderness.     Right lower leg: No edema.     Left lower leg: No edema.  Skin:    General: Skin is warm and dry.     Findings: No erythema.  Neurological:     General: No focal deficit present.     Mental Status: She is alert.     Sensory: No sensory deficit.     Motor: No weakness.  Psychiatric:        Mood and Affect: Mood and affect and mood normal.     ED Results / Procedures / Treatments   Labs (all labs ordered are listed, but only abnormal results are displayed) Labs Reviewed  URINALYSIS, ROUTINE W REFLEX MICROSCOPIC - Abnormal; Notable for the following components:      Result Value   Specific Gravity, Urine >1.046 (*)    Ketones, ur 5 (*)    All other components within normal limits  CBC WITH DIFFERENTIAL/PLATELET - Abnormal; Notable for the following components:   Hemoglobin 11.5 (*)    MCV 74.8 (*)    MCH 22.9 (*)    RDW 19.0 (*)    Platelets 424 (*)    All other components within normal limits  COMPREHENSIVE METABOLIC PANEL - Abnormal; Notable for the following components:   CO2 19 (*)    Glucose, Bld 118 (*)    Total Protein 8.8 (*)    AST 86 (*)    ALT 52 (*)    All other components within normal limits  LACTIC ACID, PLASMA  - Abnormal; Notable for the following components:   Lactic Acid, Venous 3.0 (*)    All other components within normal limits  LACTIC ACID, PLASMA - Abnormal; Notable for the following components:   Lactic Acid, Venous  2.4 (*)    All other components within normal limits  URINE CULTURE  LIPASE, BLOOD  I-STAT BETA HCG BLOOD, ED ([facility], [facility], [facility] ONLY)    EKG None  Radiology CT ABDOMEN PELVIS W CONTRAST  Result Date: 06/30/2020 CLINICAL DATA:  Abdominal pain EXAM: CT ABDOMEN AND PELVIS WITH CONTRAST TECHNIQUE: Multidetector CT imaging of the abdomen and pelvis was performed using the standard protocol following bolus administration of intravenous contrast. CONTRAST:  100mL OMNIPAQUE IOHEXOL 300 MG/ML  SOLN COMPARISON:  None. FINDINGS: Lower chest: No acute abnormality. Hepatobiliary: No focal liver abnormality is seen. No gallstones, gallbladder wall thickening, or biliary dilatation. Pancreas: Unremarkable. No pancreatic ductal dilatation or surrounding inflammatory changes. Spleen: Normal in size without focal abnormality. Adrenals/Urinary Tract: Adrenals are unremarkable. Kidneys are unremarkable. Bladder is moderately distended and otherwise unremarkable. Stomach/Bowel: Stomach is within normal limits. Bowel is normal in caliber. Cecum is near midline with normal appendix. Vascular/Lymphatic: No significant vascular findings. No enlarged lymph nodes. Reproductive: Uterus is unremarkable. 3 cm right ovarian cyst. A right ovarian cyst was also present on 20 19 ultrasound. Left ovary is unremarkable. Other: No ascites.  Abdominal wall is unremarkable. Musculoskeletal: No acute osseous abnormality. IMPRESSION: No acute abnormality or findings to account for reported symptoms. Electronically Signed   By: Guadlupe SpanishPraneil  Patel M.D.   On: 06/30/2020 10:46    Procedures Procedures   Medications Ordered in ED Medications  ondansetron (ZOFRAN) injection 4 mg (4 mg Intravenous Given 06/30/20 0905)  sodium chloride  0.9 % bolus 1,000 mL (1,000 mLs Intravenous New Bag/Given 06/30/20 0907)  iohexol (OMNIPAQUE) 300 MG/ML solution 100 mL (100 mLs Intravenous Contrast Given 06/30/20 1010)    ED Course  I have reviewed the triage vital signs and the nursing notes.  Pertinent labs & imaging results that were available during my care of the patient were reviewed by me and considered in my medical decision making (see chart for details).    MDM Rules/Calculators/A&P                          Annye Ruskina L Lindseth is a 50 y.o. female with a past medical history significant for depression and hypertension and prior breast surgery who presents with 2 weeks of constant nausea and vomiting and 1 month of bilateral flank and back pain.  Patient reports that for the last month, she has had pain in her bilateral CVA areas and flanks wraparound towards her abdomen.  She reports this is been severe.  It is constant in nature but waxes and wanes in severity.  She reports no constipation or diarrhea and denies any new urinary changes.  She denies history of trauma.  She reports over the last 2 weeks, she got to where she has not been able to eat or drink very much as she has near constant nausea and vomiting with her pain.  She denies any fevers, chills, chest pain, shortness of breath.  Denies any anterior chest pain.  Denies any lower abdominal pain or any pelvic symptoms.  She reports that her last menstrual cycle was 2 months ago and was short.  She thinks she was started with her menopause now.  She reports she does not have intercourse and is likely not pregnant.  On exam, lungs are clear and chest is nontender.  Abdomen is slightly tender on the sides of the abdomen and her bilateral flanks.  Tender in both CVA areas but nontender in the midline of  the back.  No focal neurologic deficits.  Legs nontender nonedematous.  Good pulses in extremities.  Had a shared decision made conversation with patient about how her symptoms are likely  more musculoskeletal and paraspinal in nature however, due to her nausea and vomiting, we will give her some fluids, give her some nausea medicine, and get some screening labs for 2 weeks of possible dehydration.  Given her tenderness and discomfort, we will likely also get CT imaging but will wait for labs to determine a contrasted versus noncontrasted CT scan.  Patient is agreeable to this plan.  Anticipate reassessment after work-up     Work-up was overall reassuring.  No evidence of urinary tract infection.  Lactic acid initially elevated and improved.  Suspect related to dehydration.  Pregnancy test negative and kidney function and liver function are similar to prior.  CT scan shows no concerning findings causing her discomfort.  Suspect musculoskeletal discomfort given the spasms palpated on exam and location of discomfort.  Patient given prescription for muscle relaxant and Lidoderm patches and patient agrees for PCP follow-up.  She understands return precautions and follow-up instructions and was discharged in good condition.   Final Clinical Impression(s) / ED Diagnoses Final diagnoses:  Bilateral flank pain  Muscle spasm of back    Rx / DC Orders ED Discharge Orders         Ordered    cyclobenzaprine (FLEXERIL) 10 MG tablet  2 times daily PRN        06/30/20 1318    lidocaine (LIDODERM) 5 %  Every 24 hours        06/30/20 1318          Clinical Impression: 1. Bilateral flank pain   2. Muscle spasm of back     Disposition: Discharge  Condition: Good  I have discussed the results, Dx and Tx plan with the pt(& family if present). He/she/they expressed understanding and agree(s) with the plan. Discharge instructions discussed at great length. Strict return precautions discussed and pt &/or family have verbalized understanding of the instructions. No further questions at time of discharge.    New Prescriptions   CYCLOBENZAPRINE (FLEXERIL) 10 MG TABLET    Take 1 tablet (10  mg total) by mouth 2 (two) times daily as needed for muscle spasms.   LIDOCAINE (LIDODERM) 5 %    Place 1 patch onto the skin daily. Remove & Discard patch within 12 hours or as directed by MD    Follow Up: [facility] [address] [place] [address] [phone] Schedule an appointment as soon as possible for a visit    [facility] [address] [id number] [address]c [place] [address] [phone]       Juliette Standre, Canary Brim, MD 06/30/20 1322

## 2020-06-30 NOTE — Discharge Instructions (Signed)
Your work-up today was overall reassuring.  I suspect your symptoms are related to musculoskeletal pain and spasms.  The CT scan did not show any concerning findings in your abdomen and pelvis.  Please use the muscle relaxant and the patches to help with the discomfort and please rest and stay hydrated.  Please follow-up with your primary doctor.  If any symptoms change or worsen, please return to the nearest emergency department

## 2020-07-01 LAB — URINE CULTURE

## 2022-08-08 IMAGING — CT CT ABD-PELV W/ CM
2 of 5 series · 16 of 46 positions shown, 18 images · IV contrast (omnipaque)
Comparison: None.

CLINICAL DATA: Abdominal pain

EXAM:
CT ABDOMEN AND PELVIS WITH CONTRAST
TECHNIQUE: Multidetector CT imaging of the abdomen and pelvis was performed
using the standard protocol following bolus administration of
intravenous contrast.
CONTRAST:  100mL OMNIPAQUE IOHEXOL 300 MG/ML  SOLN

[Series 2: axial st · axial · 0.74mm/px · z∈[-416,-21]mm · 13 of 93 slices shown, 15 images]
[im 7/93  soft-tissue]
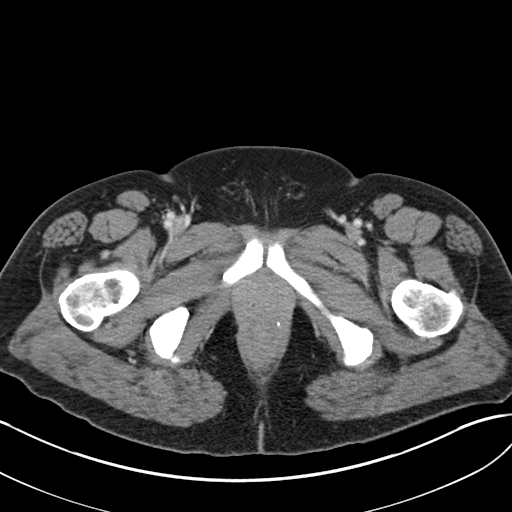
[im 7/93  bone]
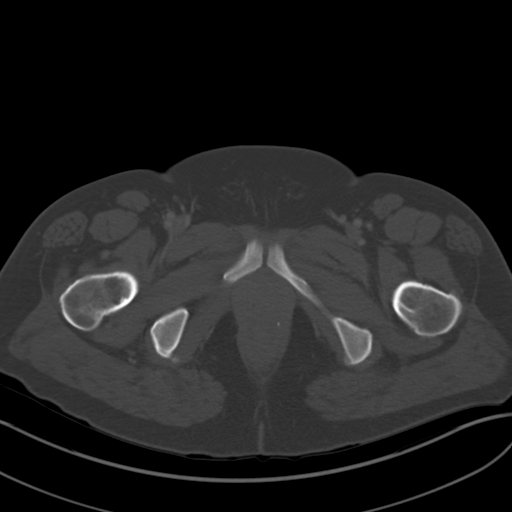
[im 14/93  soft-tissue]
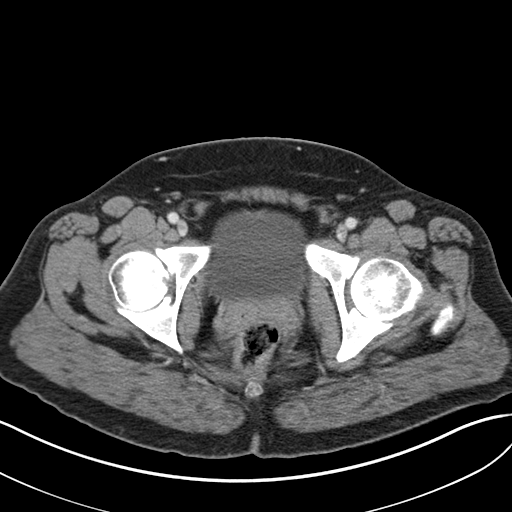
[im 20/93  soft-tissue]
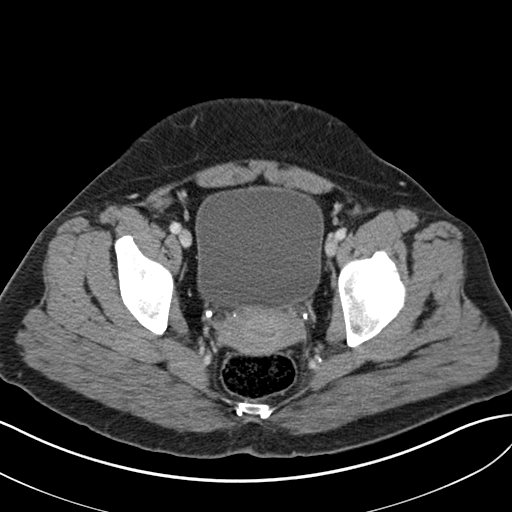
[im 27/93  soft-tissue]
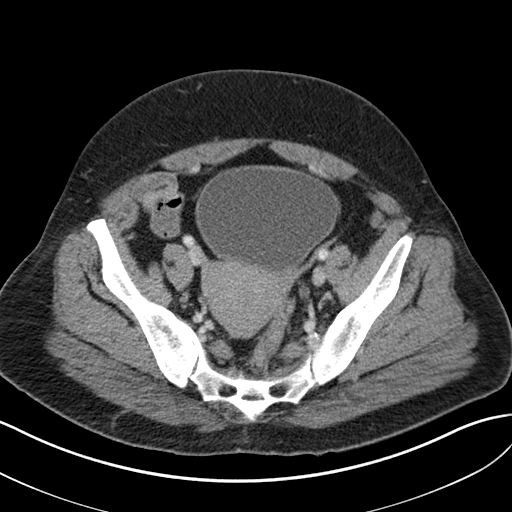
[im 33/93  soft-tissue]
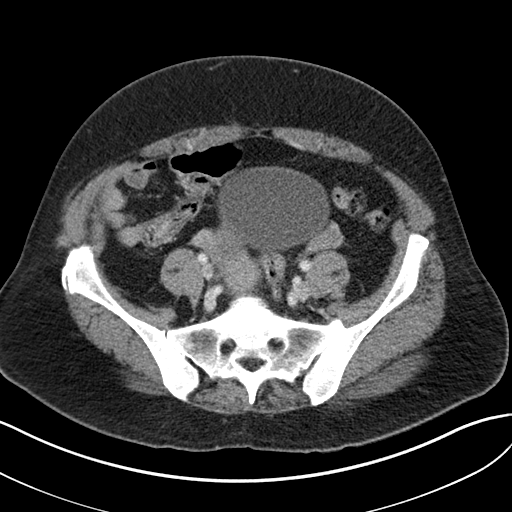
[im 40/93  soft-tissue]
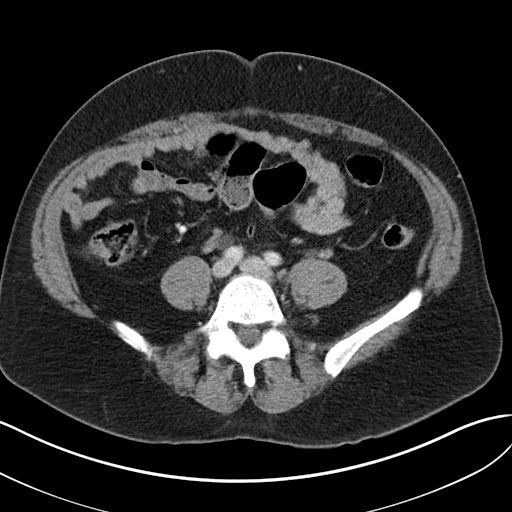
[im 47/93  soft-tissue]
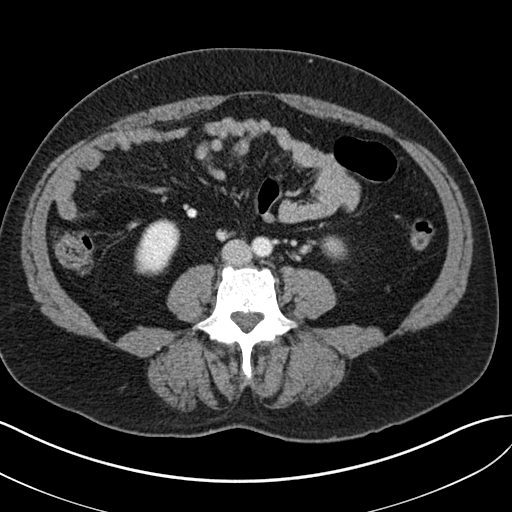
[im 53/93  soft-tissue]
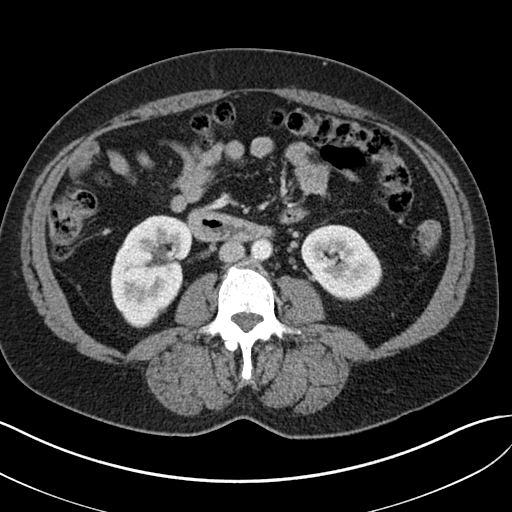
[im 60/93  soft-tissue]
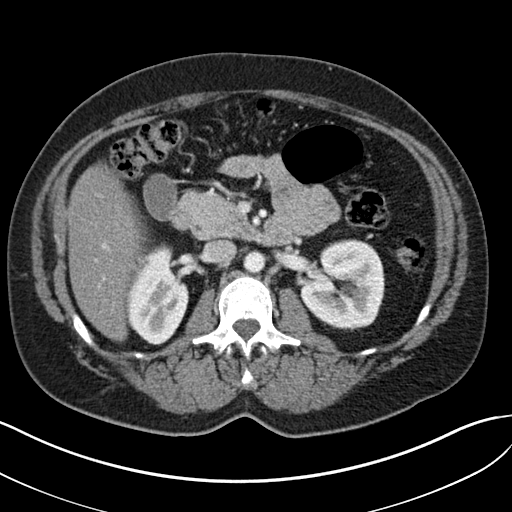
[im 60/93  bone]
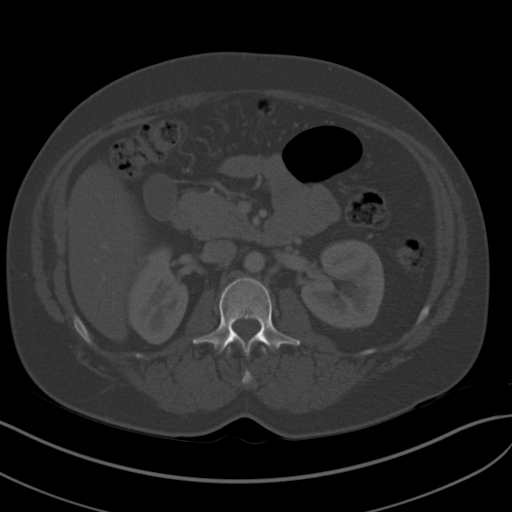
[im 66/93  soft-tissue]
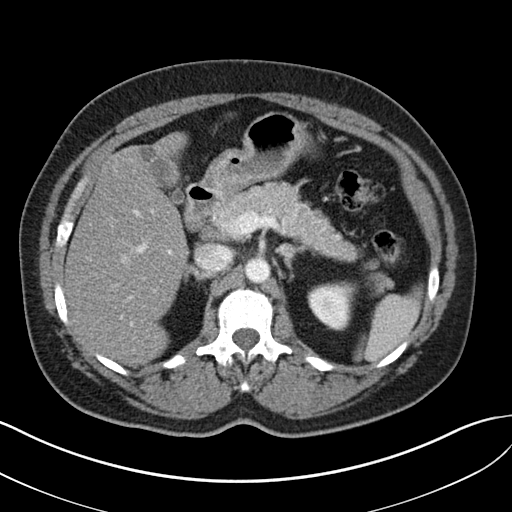
[im 73/93  soft-tissue]
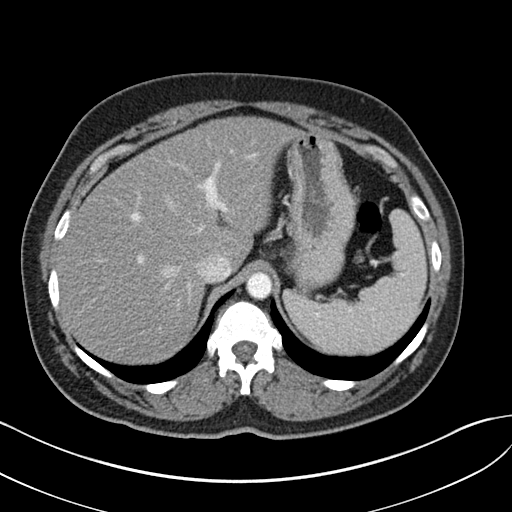
[im 79/93  soft-tissue]
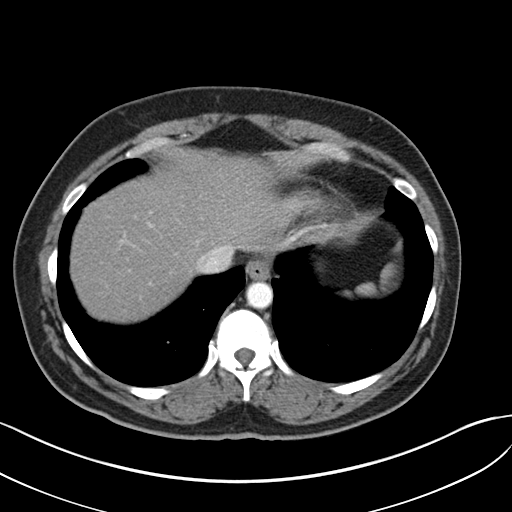
[im 86/93  soft-tissue]
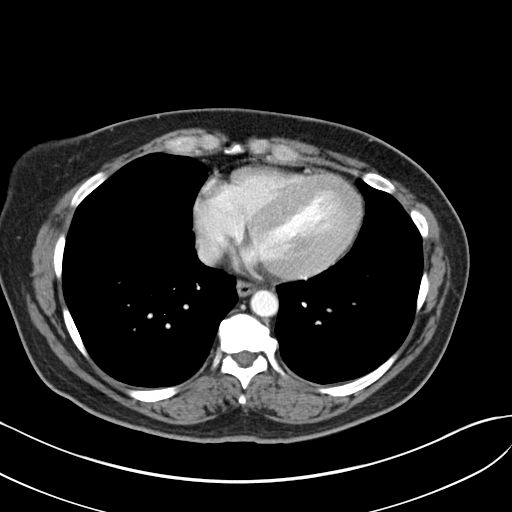

[Series 4: coronal st · coronal · 0.82mm/px · 3 of 158 slices shown]
[im 53/158  soft-tissue]
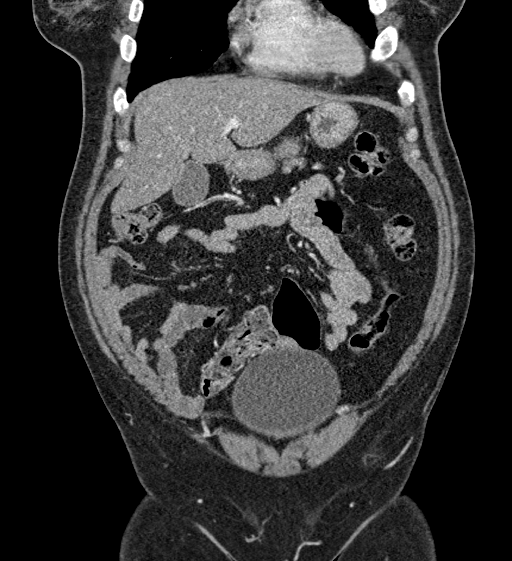
[im 70/158  soft-tissue]
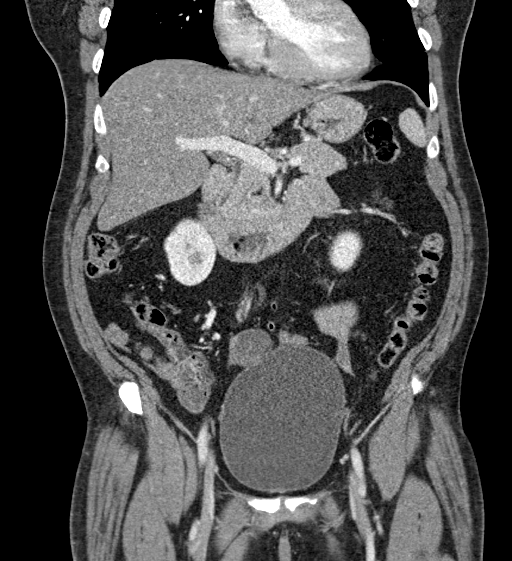
[im 88/158  soft-tissue]
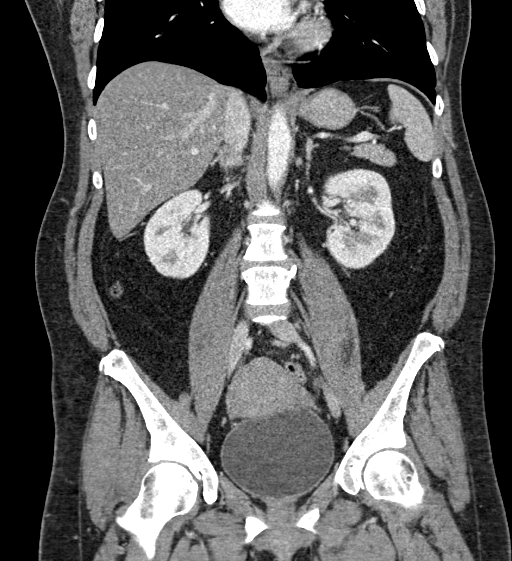

[16 of 46 positions shown; findings below may reference images not displayed]

FINDINGS: Lower chest: No acute abnormality.

Hepatobiliary: No focal liver abnormality is seen. No gallstones,
gallbladder wall thickening, or biliary dilatation.

Pancreas: Unremarkable. No pancreatic ductal dilatation or
surrounding inflammatory changes.

Spleen: Normal in size without focal abnormality.

Adrenals/Urinary Tract: Adrenals are unremarkable. Kidneys are
unremarkable. Bladder is moderately distended and otherwise
unremarkable.

Stomach/Bowel: Stomach is within normal limits. Bowel is normal in
caliber. Cecum is near midline with normal appendix.

Vascular/Lymphatic: No significant vascular findings. No enlarged
lymph nodes.

Reproductive: Uterus is unremarkable. 3 cm right ovarian cyst. A
right ovarian cyst was also present on 20 19 ultrasound. Left ovary
is unremarkable.

Other: No ascites.  Abdominal wall is unremarkable.

Musculoskeletal: No acute osseous abnormality.
IMPRESSION: No acute abnormality or findings to account for reported symptoms.

## 2022-10-03 ENCOUNTER — Ambulatory Visit: Payer: Self-pay | Admitting: Critical Care Medicine

## 2022-10-03 NOTE — Progress Notes (Deleted)
   New Patient Office Visit  Subjective    Patient ID: Linda Moon, female    DOB: 07-Jan-1971  Age: 52 y.o. MRN: [id number]  CC: No chief complaint on file.   HPI Linda Moon presents to establish care ***  Outpatient Encounter Medications as of 10/03/2022  Medication Sig   cyclobenzaprine (FLEXERIL) 10 MG tablet Take 1 tablet (10 mg total) by mouth 2 (two) times daily as needed for muscle spasms.   hydrochlorothiazide (HYDRODIURIL) 25 MG tablet Take 1 tablet (25 mg total) by mouth daily. (Patient not taking: No sig reported)   hydrochlorothiazide (HYDRODIURIL) 25 MG tablet Take 1 tablet (25 mg total) by mouth daily. (Patient not taking: Reported on 06/30/2020)   lidocaine (LIDODERM) 5 % Place 1 patch onto the skin daily. Remove & Discard patch within 12 hours or as directed by MD   naproxen sodium (ALEVE) 220 MG tablet Take 440 mg by mouth 2 (two) times daily as needed (headache/pain).   No facility-administered encounter medications on file as of 10/03/2022.    Past Medical History:  Diagnosis Date   Depression    postpartum   Headache    migraine   Hypertension     Past Surgical History:  Procedure Laterality Date   BREAST SURGERY      Family History  Problem Relation Age of Onset   Cancer Father     Social History   Socioeconomic History   Marital status: Single    Spouse name: Not on file   Number of children: Not on file   Years of education: Not on file   Highest education level: Not on file  Occupational History   Not on file  Tobacco Use   Smoking status: Never   Smokeless tobacco: Never  Vaping Use   Vaping Use: Never used  Substance and Sexual Activity   Alcohol use: Yes    Comment: 1-2/day   Drug use: Yes    Types: Cocaine    Comment: occasionally   Sexual activity: Not on file  Other Topics Concern   Not on file  Social History Narrative   Not on file   Social Determinants of Health   Financial Resource Strain: Not on file  Food  Insecurity: Not on file  Transportation Needs: Not on file  Physical Activity: Not on file  Stress: Not on file  Social Connections: Not on file  Intimate Partner Violence: Not on file    ROS      Objective    There were no vitals taken for this visit.  Physical Exam  {Labs (Optional):23779}    Assessment & Plan:   Problem List Items Addressed This Visit   None   No follow-ups on file.   Shan Levans, MD
